# Patient Record
Sex: Male | Born: 1965 | Race: White | Hispanic: No | Marital: Single | Smoking: Current every day smoker
Health system: Southern US, Community
[De-identification: ages and names within clinical notes are randomized; demographics above are authoritative.]

## PROBLEM LIST (undated history)

## (undated) DIAGNOSIS — I252 Old myocardial infarction: Secondary | ICD-10-CM

## (undated) DIAGNOSIS — E785 Hyperlipidemia, unspecified: Secondary | ICD-10-CM

## (undated) DIAGNOSIS — I82403 Acute embolism and thrombosis of unspecified deep veins of lower extremity, bilateral: Secondary | ICD-10-CM

## (undated) DIAGNOSIS — F1911 Other psychoactive substance abuse, in remission: Secondary | ICD-10-CM

## (undated) DIAGNOSIS — I739 Peripheral vascular disease, unspecified: Secondary | ICD-10-CM

## (undated) DIAGNOSIS — F419 Anxiety disorder, unspecified: Secondary | ICD-10-CM

## (undated) DIAGNOSIS — F1011 Alcohol abuse, in remission: Secondary | ICD-10-CM

## (undated) HISTORY — DX: Alcohol abuse, in remission: F10.11

## (undated) HISTORY — PX: FEMORAL BYPASS: SHX50

## (undated) HISTORY — PX: CARDIAC CATHETERIZATION: SHX172

## (undated) HISTORY — PX: CHOLECYSTECTOMY: SHX55

## (undated) HISTORY — DX: Other psychoactive substance abuse, in remission: F19.11

---

## 2001-02-03 ENCOUNTER — Emergency Department (HOSPITAL_COMMUNITY): Admission: EM | Admit: 2001-02-03 | Discharge: 2001-02-04 | Payer: Self-pay | Admitting: Emergency Medicine

## 2001-02-08 ENCOUNTER — Encounter: Payer: Self-pay | Admitting: Emergency Medicine

## 2001-02-08 ENCOUNTER — Emergency Department (HOSPITAL_COMMUNITY): Admission: EM | Admit: 2001-02-08 | Discharge: 2001-02-08 | Payer: Self-pay | Admitting: Emergency Medicine

## 2001-02-11 ENCOUNTER — Emergency Department (HOSPITAL_COMMUNITY): Admission: EM | Admit: 2001-02-11 | Discharge: 2001-02-12 | Payer: Self-pay | Admitting: Emergency Medicine

## 2001-02-12 ENCOUNTER — Encounter: Payer: Self-pay | Admitting: Emergency Medicine

## 2001-02-13 ENCOUNTER — Emergency Department (HOSPITAL_COMMUNITY): Admission: EM | Admit: 2001-02-13 | Discharge: 2001-02-13 | Payer: Self-pay | Admitting: Emergency Medicine

## 2001-02-13 ENCOUNTER — Encounter: Payer: Self-pay | Admitting: Emergency Medicine

## 2001-04-17 ENCOUNTER — Emergency Department (HOSPITAL_COMMUNITY): Admission: EM | Admit: 2001-04-17 | Discharge: 2001-04-17 | Payer: Self-pay | Admitting: Emergency Medicine

## 2001-07-07 ENCOUNTER — Emergency Department (HOSPITAL_COMMUNITY): Admission: EM | Admit: 2001-07-07 | Discharge: 2001-07-07 | Payer: Self-pay

## 2001-07-23 ENCOUNTER — Encounter: Admission: RE | Admit: 2001-07-23 | Discharge: 2001-07-23 | Payer: Self-pay | Admitting: *Deleted

## 2001-08-06 ENCOUNTER — Encounter: Admission: RE | Admit: 2001-08-06 | Discharge: 2001-08-06 | Payer: Self-pay | Admitting: *Deleted

## 2001-10-08 ENCOUNTER — Encounter: Admission: RE | Admit: 2001-10-08 | Discharge: 2001-10-08 | Payer: Self-pay | Admitting: *Deleted

## 2001-10-26 ENCOUNTER — Emergency Department (HOSPITAL_COMMUNITY): Admission: EM | Admit: 2001-10-26 | Discharge: 2001-10-27 | Payer: Self-pay | Admitting: Emergency Medicine

## 2001-10-26 ENCOUNTER — Encounter: Payer: Self-pay | Admitting: Emergency Medicine

## 2001-11-05 ENCOUNTER — Encounter: Admission: RE | Admit: 2001-11-05 | Discharge: 2001-11-05 | Payer: Self-pay | Admitting: *Deleted

## 2002-01-15 ENCOUNTER — Emergency Department (HOSPITAL_COMMUNITY): Admission: EM | Admit: 2002-01-15 | Discharge: 2002-01-16 | Payer: Self-pay | Admitting: Emergency Medicine

## 2003-11-08 ENCOUNTER — Emergency Department (HOSPITAL_COMMUNITY): Admission: EM | Admit: 2003-11-08 | Discharge: 2003-11-08 | Payer: Self-pay

## 2004-02-25 ENCOUNTER — Emergency Department (HOSPITAL_COMMUNITY): Admission: EM | Admit: 2004-02-25 | Discharge: 2004-02-25 | Payer: Self-pay | Admitting: Emergency Medicine

## 2004-02-29 ENCOUNTER — Emergency Department (HOSPITAL_COMMUNITY): Admission: EM | Admit: 2004-02-29 | Discharge: 2004-02-29 | Payer: Self-pay | Admitting: Emergency Medicine

## 2004-03-03 ENCOUNTER — Encounter (INDEPENDENT_AMBULATORY_CARE_PROVIDER_SITE_OTHER): Payer: Self-pay | Admitting: *Deleted

## 2004-03-03 ENCOUNTER — Ambulatory Visit (HOSPITAL_COMMUNITY): Admission: RE | Admit: 2004-03-03 | Discharge: 2004-03-04 | Payer: Self-pay | Admitting: General Surgery

## 2004-03-04 ENCOUNTER — Emergency Department (HOSPITAL_COMMUNITY): Admission: EM | Admit: 2004-03-04 | Discharge: 2004-03-04 | Payer: Self-pay | Admitting: Emergency Medicine

## 2005-06-28 ENCOUNTER — Emergency Department (HOSPITAL_COMMUNITY): Admission: EM | Admit: 2005-06-28 | Discharge: 2005-06-28 | Payer: Self-pay | Admitting: Family Medicine

## 2008-04-05 ENCOUNTER — Ambulatory Visit: Payer: Self-pay | Admitting: Vascular Surgery

## 2008-04-05 ENCOUNTER — Emergency Department (HOSPITAL_COMMUNITY): Admission: EM | Admit: 2008-04-05 | Discharge: 2008-04-05 | Payer: Self-pay | Admitting: Emergency Medicine

## 2008-04-05 ENCOUNTER — Encounter (INDEPENDENT_AMBULATORY_CARE_PROVIDER_SITE_OTHER): Payer: Self-pay | Admitting: Emergency Medicine

## 2008-04-07 ENCOUNTER — Ambulatory Visit (HOSPITAL_COMMUNITY): Admission: RE | Admit: 2008-04-07 | Discharge: 2008-04-07 | Payer: Self-pay | Admitting: Vascular Surgery

## 2008-04-08 ENCOUNTER — Encounter: Payer: Self-pay | Admitting: Vascular Surgery

## 2008-04-08 ENCOUNTER — Inpatient Hospital Stay (HOSPITAL_COMMUNITY): Admission: RE | Admit: 2008-04-08 | Discharge: 2008-04-13 | Payer: Self-pay | Admitting: Vascular Surgery

## 2008-04-09 ENCOUNTER — Encounter: Payer: Self-pay | Admitting: Vascular Surgery

## 2008-05-07 ENCOUNTER — Ambulatory Visit: Payer: Self-pay | Admitting: Vascular Surgery

## 2008-06-23 ENCOUNTER — Ambulatory Visit: Payer: Self-pay | Admitting: Vascular Surgery

## 2008-06-24 ENCOUNTER — Inpatient Hospital Stay (HOSPITAL_COMMUNITY): Admission: RE | Admit: 2008-06-24 | Discharge: 2008-07-09 | Payer: Self-pay | Admitting: Vascular Surgery

## 2008-06-30 ENCOUNTER — Encounter: Payer: Self-pay | Admitting: Vascular Surgery

## 2008-07-02 ENCOUNTER — Ambulatory Visit: Payer: Self-pay | Admitting: Vascular Surgery

## 2008-07-02 ENCOUNTER — Encounter: Payer: Self-pay | Admitting: Vascular Surgery

## 2008-07-03 ENCOUNTER — Encounter: Payer: Self-pay | Admitting: Vascular Surgery

## 2008-07-04 ENCOUNTER — Encounter: Payer: Self-pay | Admitting: Vascular Surgery

## 2008-07-06 ENCOUNTER — Ambulatory Visit: Payer: Self-pay | Admitting: Physical Medicine & Rehabilitation

## 2008-07-14 ENCOUNTER — Ambulatory Visit: Payer: Self-pay | Admitting: Vascular Surgery

## 2008-10-29 ENCOUNTER — Ambulatory Visit: Payer: Self-pay | Admitting: Vascular Surgery

## 2008-11-04 ENCOUNTER — Ambulatory Visit: Payer: Self-pay | Admitting: Surgery

## 2008-11-04 ENCOUNTER — Ambulatory Visit (HOSPITAL_COMMUNITY): Admission: RE | Admit: 2008-11-04 | Discharge: 2008-11-04 | Payer: Self-pay | Admitting: Surgery

## 2008-12-16 ENCOUNTER — Ambulatory Visit: Payer: Self-pay | Admitting: Vascular Surgery

## 2008-12-17 ENCOUNTER — Ambulatory Visit: Payer: Self-pay | Admitting: Vascular Surgery

## 2009-01-07 ENCOUNTER — Emergency Department (HOSPITAL_COMMUNITY): Admission: EM | Admit: 2009-01-07 | Discharge: 2009-01-07 | Payer: Self-pay | Admitting: Emergency Medicine

## 2009-01-18 ENCOUNTER — Ambulatory Visit: Payer: Self-pay | Admitting: Vascular Surgery

## 2009-01-21 ENCOUNTER — Ambulatory Visit: Payer: Self-pay | Admitting: Vascular Surgery

## 2009-01-25 ENCOUNTER — Inpatient Hospital Stay (HOSPITAL_COMMUNITY): Admission: RE | Admit: 2009-01-25 | Discharge: 2009-02-01 | Payer: Self-pay | Admitting: Vascular Surgery

## 2009-01-25 ENCOUNTER — Ambulatory Visit: Payer: Self-pay | Admitting: Vascular Surgery

## 2009-01-25 ENCOUNTER — Encounter: Payer: Self-pay | Admitting: Vascular Surgery

## 2009-01-26 ENCOUNTER — Ambulatory Visit: Payer: Self-pay | Admitting: Oncology

## 2009-01-26 ENCOUNTER — Encounter: Payer: Self-pay | Admitting: Vascular Surgery

## 2009-01-27 ENCOUNTER — Ambulatory Visit: Payer: Self-pay | Admitting: Oncology

## 2009-02-15 ENCOUNTER — Ambulatory Visit: Payer: Self-pay | Admitting: Family Medicine

## 2009-02-15 DIAGNOSIS — K5900 Constipation, unspecified: Secondary | ICD-10-CM | POA: Insufficient documentation

## 2009-02-15 DIAGNOSIS — K449 Diaphragmatic hernia without obstruction or gangrene: Secondary | ICD-10-CM | POA: Insufficient documentation

## 2009-02-15 DIAGNOSIS — F4310 Post-traumatic stress disorder, unspecified: Secondary | ICD-10-CM | POA: Insufficient documentation

## 2009-02-15 DIAGNOSIS — K219 Gastro-esophageal reflux disease without esophagitis: Secondary | ICD-10-CM | POA: Insufficient documentation

## 2009-02-15 DIAGNOSIS — I7389 Other specified peripheral vascular diseases: Secondary | ICD-10-CM | POA: Insufficient documentation

## 2009-02-16 ENCOUNTER — Encounter (INDEPENDENT_AMBULATORY_CARE_PROVIDER_SITE_OTHER): Payer: Self-pay | Admitting: Family Medicine

## 2009-02-16 LAB — CONVERTED CEMR LAB: TSH: 0.572 u[IU]/mL

## 2009-02-18 ENCOUNTER — Telehealth (INDEPENDENT_AMBULATORY_CARE_PROVIDER_SITE_OTHER): Payer: Self-pay | Admitting: Family Medicine

## 2009-03-01 ENCOUNTER — Ambulatory Visit: Payer: Self-pay | Admitting: Family Medicine

## 2009-03-09 ENCOUNTER — Encounter (INDEPENDENT_AMBULATORY_CARE_PROVIDER_SITE_OTHER): Payer: Self-pay | Admitting: Family Medicine

## 2009-03-15 ENCOUNTER — Ambulatory Visit: Payer: Self-pay | Admitting: Family Medicine

## 2009-03-28 ENCOUNTER — Telehealth (INDEPENDENT_AMBULATORY_CARE_PROVIDER_SITE_OTHER): Payer: Self-pay | Admitting: Family Medicine

## 2009-03-29 ENCOUNTER — Ambulatory Visit: Payer: Self-pay | Admitting: Family Medicine

## 2009-03-29 ENCOUNTER — Ambulatory Visit: Payer: Self-pay | Admitting: Oncology

## 2009-03-30 ENCOUNTER — Encounter (INDEPENDENT_AMBULATORY_CARE_PROVIDER_SITE_OTHER): Payer: Self-pay | Admitting: Family Medicine

## 2009-03-30 LAB — CONVERTED CEMR LAB
Basophils Relative: 0 % (ref 0–1)
Eosinophils Absolute: 0.1 10*3/uL (ref 0.0–0.7)
Eosinophils Relative: 1 % (ref 0–5)
Hemoglobin: 14.6 g/dL (ref 13.0–17.0)
MCHC: 33.2 g/dL (ref 30.0–36.0)
MCV: 81.3 fL (ref 78.0–100.0)
Monocytes Absolute: 0.7 10*3/uL (ref 0.1–1.0)
Monocytes Relative: 7 % (ref 3–12)
RBC: 5.41 M/uL (ref 4.22–5.81)

## 2009-04-06 ENCOUNTER — Encounter (INDEPENDENT_AMBULATORY_CARE_PROVIDER_SITE_OTHER): Payer: Self-pay | Admitting: Family Medicine

## 2009-04-06 LAB — CBC WITH DIFFERENTIAL/PLATELET
Basophils Absolute: 0 10*3/uL (ref 0.0–0.1)
EOS%: 0.2 % (ref 0.0–7.0)
HGB: 14 g/dL (ref 13.0–17.1)
MCH: 27.5 pg (ref 27.2–33.4)
MCV: 80.8 fL (ref 79.3–98.0)
MONO%: 6.1 % (ref 0.0–14.0)
RBC: 5.1 10*6/uL (ref 4.20–5.82)
RDW: 14.1 % (ref 11.0–14.6)

## 2009-04-06 LAB — PROTIME-INR: Protime: 14.4 Seconds — ABNORMAL HIGH (ref 10.6–13.4)

## 2009-04-07 ENCOUNTER — Encounter (INDEPENDENT_AMBULATORY_CARE_PROVIDER_SITE_OTHER): Payer: Self-pay | Admitting: Family Medicine

## 2009-04-08 ENCOUNTER — Ambulatory Visit: Payer: Self-pay | Admitting: Vascular Surgery

## 2009-04-08 ENCOUNTER — Encounter (INDEPENDENT_AMBULATORY_CARE_PROVIDER_SITE_OTHER): Payer: Self-pay | Admitting: *Deleted

## 2009-04-08 LAB — CONVERTED CEMR LAB: TSH: 1.252 microintl units/mL

## 2009-04-11 ENCOUNTER — Ambulatory Visit: Payer: Self-pay | Admitting: Family Medicine

## 2009-04-13 ENCOUNTER — Encounter (INDEPENDENT_AMBULATORY_CARE_PROVIDER_SITE_OTHER): Payer: Self-pay | Admitting: Family Medicine

## 2009-04-14 ENCOUNTER — Telehealth (INDEPENDENT_AMBULATORY_CARE_PROVIDER_SITE_OTHER): Payer: Self-pay | Admitting: Family Medicine

## 2009-04-25 ENCOUNTER — Ambulatory Visit (HOSPITAL_COMMUNITY): Payer: Self-pay | Admitting: Psychology

## 2009-05-09 ENCOUNTER — Telehealth (INDEPENDENT_AMBULATORY_CARE_PROVIDER_SITE_OTHER): Payer: Self-pay | Admitting: Family Medicine

## 2009-05-10 ENCOUNTER — Ambulatory Visit (HOSPITAL_COMMUNITY): Payer: Self-pay | Admitting: Psychology

## 2009-05-10 ENCOUNTER — Ambulatory Visit: Payer: Self-pay | Admitting: Family Medicine

## 2009-05-10 ENCOUNTER — Telehealth (INDEPENDENT_AMBULATORY_CARE_PROVIDER_SITE_OTHER): Payer: Self-pay | Admitting: *Deleted

## 2009-05-10 LAB — CONVERTED CEMR LAB
HDL goal, serum: 40 mg/dL
LDL Goal: 70 mg/dL

## 2009-05-24 ENCOUNTER — Ambulatory Visit: Payer: Self-pay | Admitting: Family Medicine

## 2009-05-24 LAB — CONVERTED CEMR LAB: INR: 1.8

## 2009-06-28 ENCOUNTER — Encounter (INDEPENDENT_AMBULATORY_CARE_PROVIDER_SITE_OTHER): Payer: Self-pay | Admitting: Family Medicine

## 2009-07-29 ENCOUNTER — Emergency Department (HOSPITAL_COMMUNITY): Admission: EM | Admit: 2009-07-29 | Discharge: 2009-07-29 | Payer: Self-pay | Admitting: Emergency Medicine

## 2009-07-30 ENCOUNTER — Ambulatory Visit (HOSPITAL_COMMUNITY): Admission: RE | Admit: 2009-07-30 | Discharge: 2009-07-30 | Payer: Self-pay | Admitting: Emergency Medicine

## 2009-09-21 ENCOUNTER — Emergency Department (HOSPITAL_COMMUNITY): Admission: EM | Admit: 2009-09-21 | Discharge: 2009-09-21 | Payer: Self-pay | Admitting: Emergency Medicine

## 2009-10-02 IMAGING — XA IR PTA ILIAC ART INIT VESSEL
12 of 17 series · 12 of 24 positions shown · non-contrast
Comparison: none

EXAMINATION:

RIGHT LOWER EXTREMITY FOLLOWUP ANGIOGRAM AFTER 48 HOURS OF ARTERIAL
THROMBOLYSIS
RIGHT LOWER EXTREMITY ARTERIAL MECHANICAL THROMBECTOMY
PERIPHERAL ARTERIAL 4 AND 5 MM ANGIOPLASTY
REPLACEMENT OF INTRA-ARTERIAL THROMBOLYTIC INFUSION CATHETER AND
INFUSION GUIDE WIRE.
CLINICAL DATA: 41-year-old male thrombosed right distal SFA to below
knee popliteal bypass.

[Series 1: run · 1 of 23 slices shown (1 of 12)]
[im 23/23]
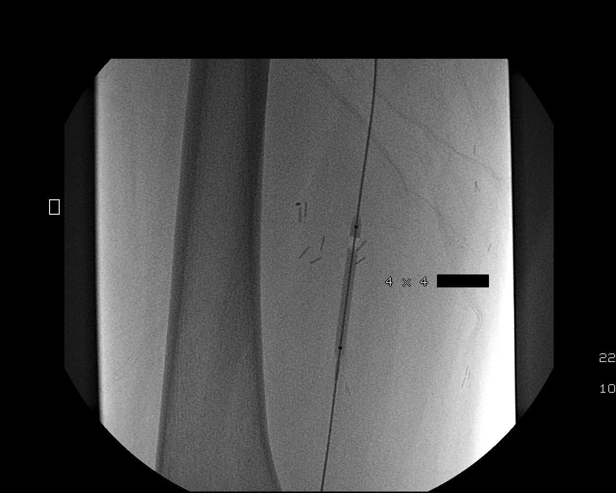

[Series 1: run · 1 of 36 slices shown (2 of 12)]
[im 36/36]
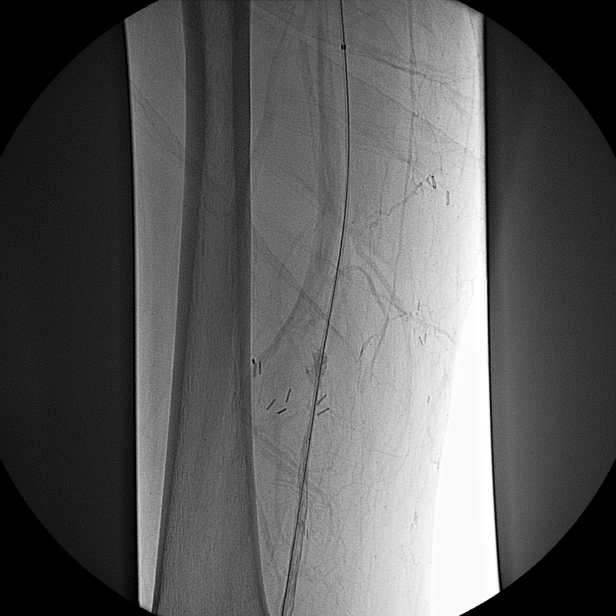

[Series 3: run · 1 of 33 slices shown (3 of 12)]
[im 33/33]
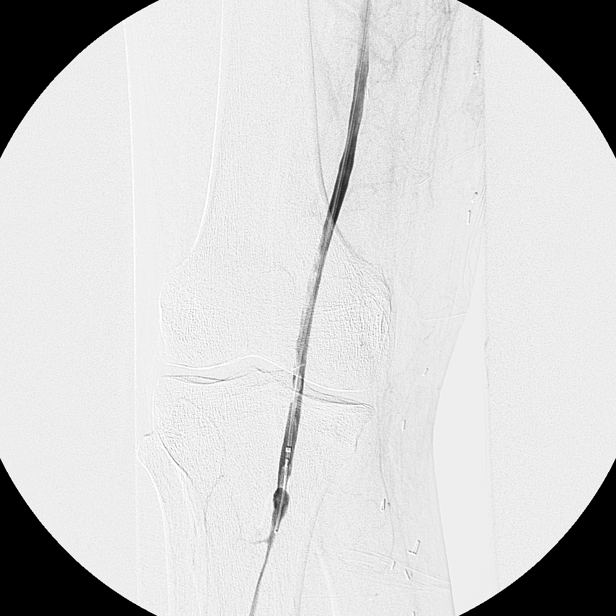

[Series 4: run · 1 of 46 slices shown (4 of 12)]
[im 46/46]
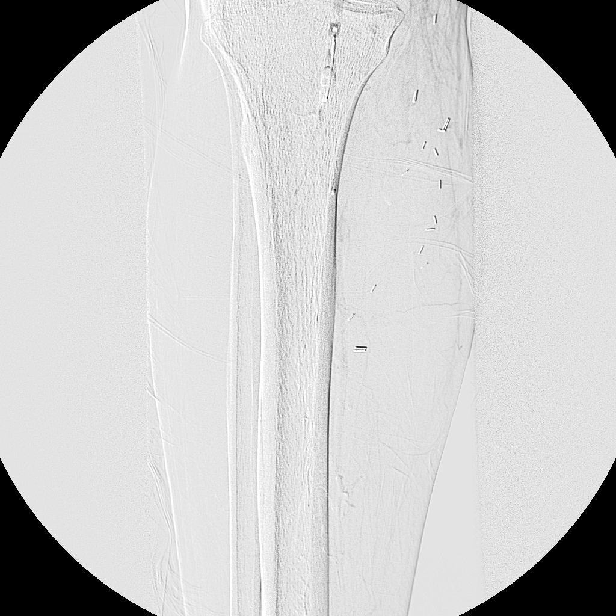

[Series 6: run · 1 of 15 slices shown (5 of 12)]
[im 1/15]
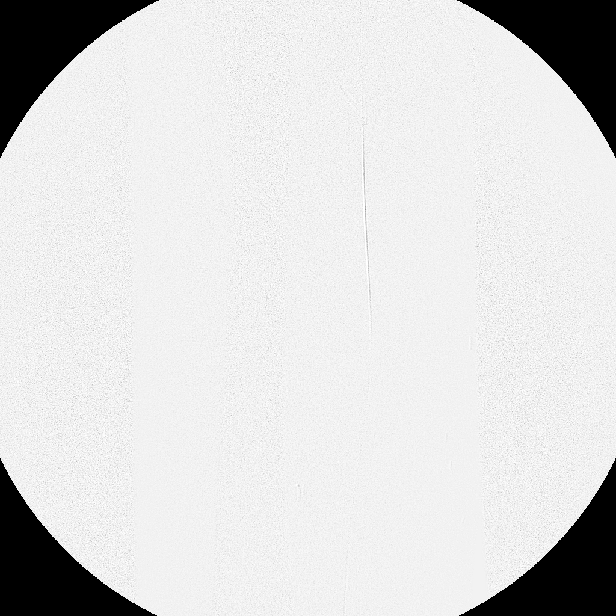

[Series 8: run · 1 of 21 slices shown (6 of 12)]
[im 1/21]
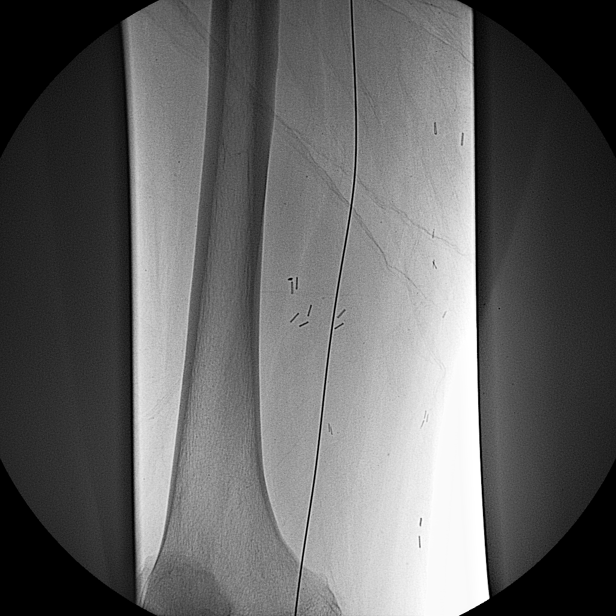

[Series 9: run · 1 of 39 slices shown (7 of 12)]
[im 39/39]
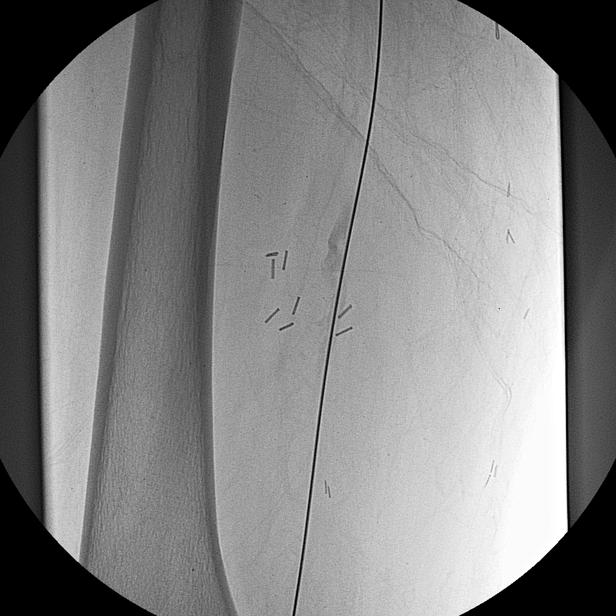

[Series 11: run · 1 of 20 slices shown (8 of 12)]
[im 1/20]
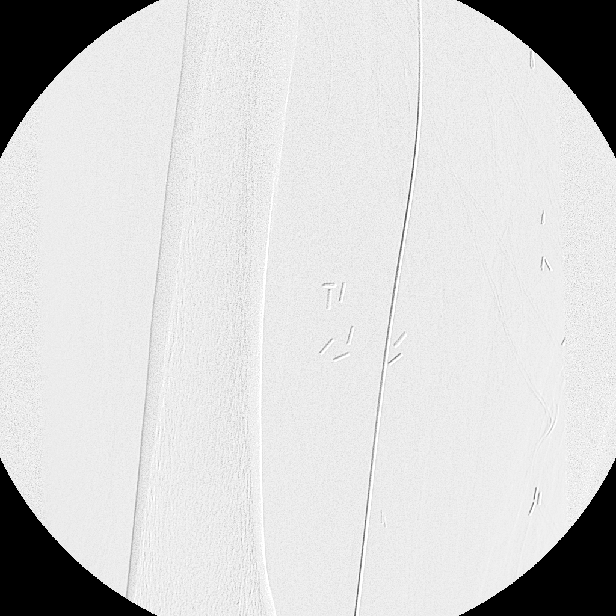

[Series 13: run · 1 of 17 slices shown (9 of 12)]
[im 1/17]
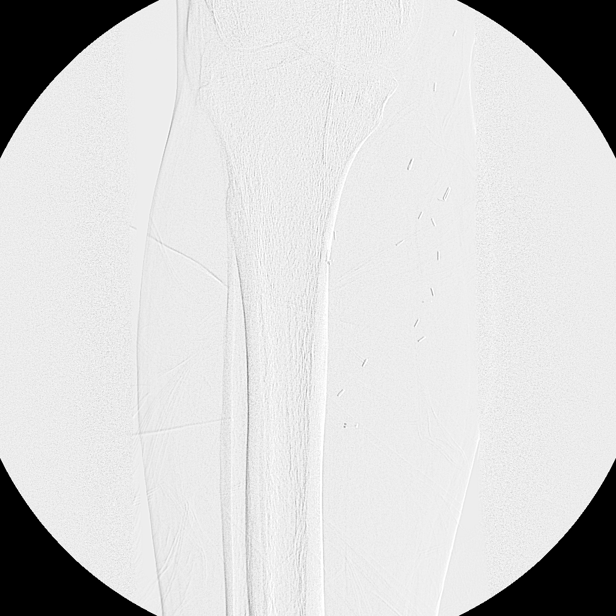

[Series 14: run · 1 of 31 slices shown (10 of 12)]
[im 31/31]
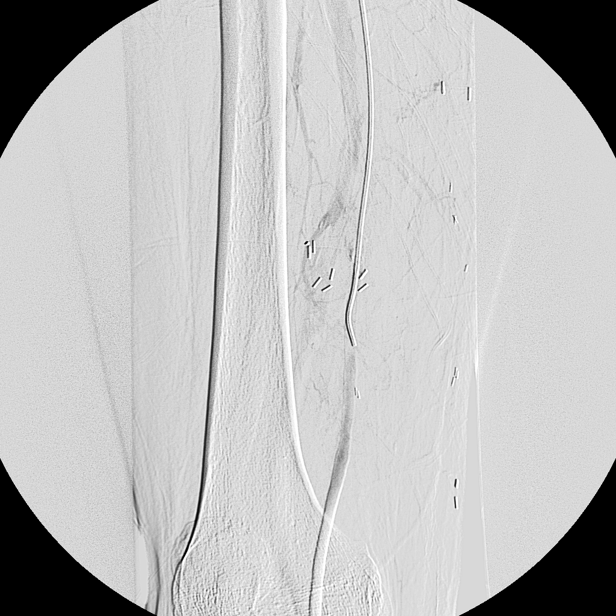

[Series 16: run · 1 of 34 slices shown (11 of 12)]
[im 1/34]
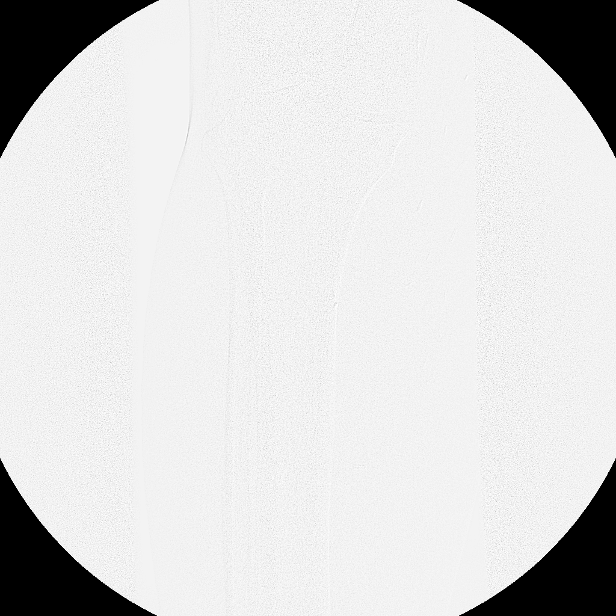

[Series 17: run · 1 of 21 slices shown (12 of 12)]
[im 1/21]
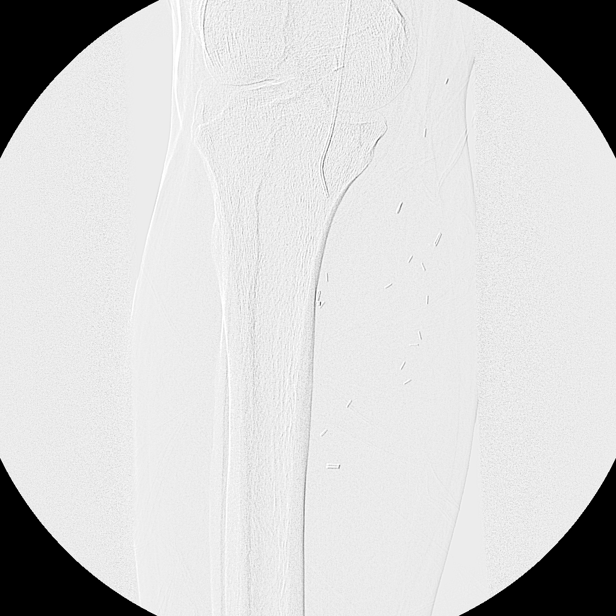

[12 of 24 positions shown; findings below may reference images not displayed]

Date:06/26/2008 [DATE]

Radiologist:RICHARD ADRIAN.Nikky, M.D.

Medications:Versed and Fentanyl for conscious sedation, 600 mcg
intra-arterial nitroglycerin

Guidance:Fluoroscopic

Fluoroscopy time:15 minutes

Sedation time:60 minutes

Contrast volume:125 ml

Complications:No immediate

PROCEDURE/FINDINGS:

Informed consent was obtained from the patient following
explanation of the procedure, risks, benefits and alternatives.
The patient understands, agrees and consents for the procedure.
All questions were addressed.  A time out was performed.

Initially the existing infusion catheter was injected for a follow-
up right lower extremity angiogram.  This demonstrated poor
visualization of the bypass graft and the distal runoff.  The inner
occluding wire was removed and additional angiograms were performed
to visualize the distal runoff.

Right lower extremity angiogram:  Irregular residual filling
defects are present at the proximal anastomosis to the distal SFA.
Small amount of residual intra graft thrombus within the vein
bypass conduit.  Distal anastomosis demonstrates no significant
runoff vessel.  Therefore the inner occluding wire was removed and
repeat injection was performed.  This demonstrated patency of the
distal anastomosis to the tibial peroneal trunk with a single
vessel very small posterior tibial runoff identified.

Mechanical thrombectomy:  The infusion catheter was removed over a
Rosen exchange guide wire.  A 55 cm 6-French sheath was advanced
into the right mid SFA.  Mechanical thrombectomy was performed with
the AngioJet device, total run volume 370 ml heparinized saline
across the entire thrombosed bypass.  Following thrombectomy,
additional angiograms were performed.  This demonstrated very
limited flow through the bypass related to the small caliber distal
outflow.

4 and 5 mm peripheral arterial angioplasty: Over the Rosen exchange
guide wire, initially 4 mm angioplasty was performed of the
proximal anastomosis and throughout the vein bypass conduit
extending to the distal anastomosis.  This was performed with a 4
mm x 4 cm Pitoirizarry Janetzy balloon.  Followup angiogram demonstrated no
significant improvement in flow through the bypass.  Therefore the
balloon was upsized to a 5 mm x 4 cm Pitoirizarry Janetzy balloon.  Repeat
overlapping angioplasty was performed of the proximal anastomosis
and throughout the vein bypass conduit to the distal anastomosis.
Following this, a repeat angiogram again failed to demonstrate
significant flow through the bypass graft related to the small
caliber distal outflow posterior tibial artery.  For better
visualization, a 5-French catheter was advanced over the guide wire
and positioned within the graft.  Contrast injection was performed
to completely opacify the bypass graft.  At this point, there is no
significant residual intragraft stenosis or clot.  Small air
bubbles are present.  Flow remains stagnant.  Reflux across the
arterial anastomosis demonstrates no significant residual proximal
anastomotic stenosis.  However, despite these efforts, flow within
the graft is very sluggish with no significant outflow.  Again,
this appears to be related to the small caliber posterior tibial
outflow below the distal anastomosis.

Replacement of the infusion catheter with the addition of an
infusion guide wire:  A 0.018 SV-8 guide wire was advanced across
the distal anastomosis into the outflow posterior tibial artery.
Over this exchange guide wire, a new 5-French infusion catheter
with a 40 cm infusion length  was advanced with the tip positioned
across the distal anastomosis.  In addition, a 12 cm Manoo
infusion guide wire was advanced into the posterior tibial outflow
more peripherally.  Position was confirmed with fluoroscopy.  The
infusion catheter and infusion guide wire were reconnected for
intra-arterial thrombolytic infusion with T N K , total infusion
0.3 mg per hour.
IMPRESSION: After 48 hours of intra-arterial thrombolysis, residual
clot remains in the right distal SFA to below knee popliteal vein
bypass conduit.

Despite transcatheter mechanical thrombectomy, as well as 4 and 5
mm peripheral angioplasty, arterial flow through the bypass remains
very sluggish.  The proximal anastomosis following this additional
treatment is patent.  The distal anastomosis is also patent however
the outflow artery remains very diminutive in caliber.

Infusion catheter with the addition of a infusion guide wire were
advanced more peripherally across the distal anastomosis into the
outflow posterior tibial artery to continue thrombolytic infusion.

Follow-up angiogram will be performed 06/27/2008.

Critical test results were discussed with Dr. Sine at the time

## 2009-10-08 ENCOUNTER — Emergency Department (HOSPITAL_COMMUNITY): Admission: EM | Admit: 2009-10-08 | Discharge: 2009-10-08 | Payer: Self-pay | Admitting: Emergency Medicine

## 2009-10-14 ENCOUNTER — Ambulatory Visit (HOSPITAL_COMMUNITY): Admission: RE | Admit: 2009-10-14 | Discharge: 2009-10-14 | Payer: Self-pay | Admitting: Family Medicine

## 2009-10-27 ENCOUNTER — Ambulatory Visit: Payer: Self-pay | Admitting: Cardiovascular Disease

## 2009-10-27 ENCOUNTER — Encounter (INDEPENDENT_AMBULATORY_CARE_PROVIDER_SITE_OTHER): Payer: Self-pay | Admitting: *Deleted

## 2009-11-01 ENCOUNTER — Telehealth (INDEPENDENT_AMBULATORY_CARE_PROVIDER_SITE_OTHER): Payer: Self-pay

## 2009-11-01 ENCOUNTER — Encounter: Payer: Self-pay | Admitting: Cardiovascular Disease

## 2009-11-11 ENCOUNTER — Telehealth (INDEPENDENT_AMBULATORY_CARE_PROVIDER_SITE_OTHER): Payer: Self-pay | Admitting: *Deleted

## 2009-11-12 ENCOUNTER — Ambulatory Visit: Payer: Self-pay | Admitting: Vascular Surgery

## 2009-11-12 ENCOUNTER — Encounter: Payer: Self-pay | Admitting: Internal Medicine

## 2009-11-12 ENCOUNTER — Inpatient Hospital Stay (HOSPITAL_COMMUNITY): Admission: EM | Admit: 2009-11-12 | Discharge: 2009-11-25 | Payer: Self-pay | Admitting: Emergency Medicine

## 2009-11-13 ENCOUNTER — Encounter: Payer: Self-pay | Admitting: Vascular Surgery

## 2009-11-14 ENCOUNTER — Encounter: Payer: Self-pay | Admitting: Vascular Surgery

## 2009-11-18 ENCOUNTER — Telehealth (INDEPENDENT_AMBULATORY_CARE_PROVIDER_SITE_OTHER): Payer: Self-pay

## 2009-11-24 ENCOUNTER — Encounter (INDEPENDENT_AMBULATORY_CARE_PROVIDER_SITE_OTHER): Payer: Self-pay | Admitting: *Deleted

## 2009-11-27 ENCOUNTER — Emergency Department (HOSPITAL_COMMUNITY): Admission: EM | Admit: 2009-11-27 | Discharge: 2009-11-27 | Payer: Self-pay | Admitting: Emergency Medicine

## 2009-12-07 ENCOUNTER — Ambulatory Visit: Payer: Self-pay | Admitting: Cardiology

## 2009-12-07 LAB — CONVERTED CEMR LAB: POC INR: 1.6

## 2009-12-12 ENCOUNTER — Emergency Department (HOSPITAL_COMMUNITY): Admission: EM | Admit: 2009-12-12 | Discharge: 2009-12-12 | Payer: Self-pay | Admitting: Emergency Medicine

## 2009-12-12 ENCOUNTER — Encounter (INDEPENDENT_AMBULATORY_CARE_PROVIDER_SITE_OTHER): Payer: Self-pay | Admitting: *Deleted

## 2009-12-21 ENCOUNTER — Ambulatory Visit: Payer: Self-pay | Admitting: Vascular Surgery

## 2010-01-04 ENCOUNTER — Ambulatory Visit: Payer: Self-pay | Admitting: Vascular Surgery

## 2010-01-25 ENCOUNTER — Encounter (INDEPENDENT_AMBULATORY_CARE_PROVIDER_SITE_OTHER): Payer: Self-pay | Admitting: Pharmacist

## 2010-01-25 ENCOUNTER — Encounter (INDEPENDENT_AMBULATORY_CARE_PROVIDER_SITE_OTHER): Payer: Self-pay | Admitting: *Deleted

## 2010-01-25 DIAGNOSIS — F17200 Nicotine dependence, unspecified, uncomplicated: Secondary | ICD-10-CM

## 2010-01-25 DIAGNOSIS — R079 Chest pain, unspecified: Secondary | ICD-10-CM

## 2010-02-06 ENCOUNTER — Ambulatory Visit: Payer: Self-pay | Admitting: Vascular Surgery

## 2010-02-14 ENCOUNTER — Encounter
Admission: RE | Admit: 2010-02-14 | Discharge: 2010-02-17 | Payer: Self-pay | Admitting: Physical Medicine & Rehabilitation

## 2010-02-16 ENCOUNTER — Encounter: Payer: Self-pay | Admitting: Cardiology

## 2010-02-17 ENCOUNTER — Ambulatory Visit: Payer: Self-pay | Admitting: Physical Medicine & Rehabilitation

## 2010-03-06 ENCOUNTER — Other Ambulatory Visit: Payer: Self-pay

## 2010-03-06 ENCOUNTER — Other Ambulatory Visit: Payer: Self-pay | Admitting: Emergency Medicine

## 2010-03-06 ENCOUNTER — Inpatient Hospital Stay (HOSPITAL_COMMUNITY): Admission: AD | Admit: 2010-03-06 | Discharge: 2010-03-16 | Payer: Self-pay | Admitting: Psychiatry

## 2010-03-06 ENCOUNTER — Ambulatory Visit: Payer: Self-pay | Admitting: Psychiatry

## 2010-03-14 ENCOUNTER — Other Ambulatory Visit: Payer: Self-pay | Admitting: Emergency Medicine

## 2010-03-30 ENCOUNTER — Ambulatory Visit: Payer: Self-pay | Admitting: Oncology

## 2010-09-07 ENCOUNTER — Observation Stay (HOSPITAL_COMMUNITY): Admission: EM | Admit: 2010-09-07 | Discharge: 2010-02-06 | Payer: Self-pay | Admitting: Emergency Medicine

## 2010-09-14 ENCOUNTER — Emergency Department (HOSPITAL_COMMUNITY)
Admission: EM | Admit: 2010-09-14 | Discharge: 2010-09-14 | Payer: Self-pay | Source: Home / Self Care | Admitting: Emergency Medicine

## 2010-10-03 ENCOUNTER — Ambulatory Visit: Admit: 2010-10-03 | Payer: Self-pay | Admitting: Vascular Surgery

## 2010-10-11 ENCOUNTER — Ambulatory Visit (HOSPITAL_COMMUNITY)
Admission: RE | Admit: 2010-10-11 | Discharge: 2010-10-11 | Payer: Self-pay | Source: Home / Self Care | Attending: Psychiatry | Admitting: Psychiatry

## 2010-10-22 ENCOUNTER — Encounter: Payer: Self-pay | Admitting: Cardiovascular Disease

## 2010-10-31 NOTE — Progress Notes (Signed)
Summary: PAIN MEDS  Phone Note Call from Patient Call back at [phone]   Caller: crystal overby Reason for Call: Talk to Nurse Summary of Call: calling because he is hurting all the time, legs mostly. Patient would like some pain killers if possible tylenol not working. Initial call taken by: Faythe Ghee,  November 11, 2009 3:07 PM  Follow-up for Phone Call        [facility] we do not prescribe pain meds Follow-up by: Teressa Lower RN,  November 11, 2009 5:13 PM

## 2010-10-31 NOTE — Assessment & Plan Note (Signed)
Summary: **NP3 ABNORMAL ECHO   Visit Type:  Initial Consult Primary Provider:  John Giovanni   History of Present Illness: [name] is seen today at the request of Dr Sudie Bailey for vascular disease and an abnormal "echo" The patient has an extensive history of LE vascular disease with 3 surgeries to the lower extemities and graft failure.  He has bilateral LE to posterior tibial bypasses.  It is unclear to me after reviewing notes from Dr Cyndie Chime and Debera Lat whether he really has a hypercoagulable syndrome or not.  When checked his farctors were normal except for V111 in the setting of acute RLE ischemia which may have been an acute phase reactant.  Nerver the less he is supposed to have been on chronic coumadin over the last year.  At one point he was having it checked at Dr York Spaniel  He has not had it checked in over 6 weeks and today in our office it was non-Rx at 1.3.  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  I reviewed his [place] on his carotid done 10/11/2009 and it only showed mild plaque with no critical stenosis.  As far as I can tell this was the 'abnormal" echo.  Depspite his prematur vascular disease it does not appear tht he has at a ETT or myovue.  After asked he indicates having chest pain when he is stressed.    My concern with him is compliance and the lack of F.U with his INR.  His girlfriend was inquiring about janovetin.  I told her this was just coumadin.  He is a better candidate for Pradaxa 150 two times a day as this would not need to be monitored and would still anticoagulate him in the setting of previous graft failures.    Current Problems (verified): 1)  ? 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 Once Daily 2)  Aspir-Low 81 Mg Tbec (Aspirin) .... Take 1 Tab Daily 3)  Pradaxa 150mg  .... Take 1 Tablet By Mouth Two Times A Day  Allergies (verified): No Known Drug Allergies  Past History:  Past Medical History: Last updated: 10/27/2009 Current Problems:  ? 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  Right superficial femoral artery tibioperoneal trunk bypass with thrombectomy of his right popliteal artery  04/08/2008. 2. Popliteal endartrectomy in October 2009 3. right femoral to posterior tibial bypass   with translocated nonreversed saphenous vein from the left leg, left   popliteal posterior tibial and peroneal artery thrombectomy and vein   patch angioplasty of the left popliteal artery, bilateral lower extremity arteriogram by Dr. Tawanna Cooler Early - April 2010 4. 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 All other systems reviewed and negative except as noted in HPI   Vital Signs:  Patient profile:   45 year old male Height:      70 inches Weight:      210 pounds Pulse rate:   95 / minute BP sitting:   122 / 80  (right arm)  Vitals Entered By: Dreama Saa, CNA (October 27, 2009 2:24 PM)  Physical Exam  General:  Affect appropriate Healthy:  appears stated age HEENT: normal Neck supple with no adenopathy JVP normal no bruits no thyromegaly Lungs clear with no wheezing and good diaphragmatic motion Heart:  S1/S2 no murmur,rub, gallop or click PMI normal Abdomen: benighn, BS positve, no tenderness, no AAA no bruit.  No HSM or HJR Distal pulses intact with no bruits No edema Neuro non-focal Skin warm and dry S/P vascular surgery bilaterally to postierior tibials   Impression & Recommendations:  Problem # 1:  COUMADIN THERAPY (ICD-V58.61) Not clear to me if he has a coagulopathy.  Encouraged him to F/U with Dr Cyndie Chime.  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  he has below knee grafts and would likely benefit from anticoagulation.  Compliance and monitoring issues would make Pradaxa 150 two times a day better choice.  Will see if we can arrange this.  Problem # 2:  PVD WITH CLAUDICATION (ICD-443.89) F/U Dr Arbie Cookey.  No acute ischemia with improved ABI's since last surgery.  Difficult to differentiate neuropathy from claudication but I believe most of his current limitation is neuropathic  Problem # 3:  CHEST PAIN UNSPECIFIED (ICD-786.50) Atypical in the setting of vascular disease and previous smoking.   Stress myovue The following medications were removed from the medication list:    Coumadin 10 Mg Tabs (Warfarin sodium) .Marland Kitchen... Take 10 mg on mon, wed and friday  and sunday with 7.5 mg all others His updated medication list for this problem includes:    Aspir-low 81 Mg Tbec (Aspirin) .Marland Kitchen... Take 1 tab daily  Other Orders: Nuclear Stress Test  (Nuc Stress Test)  Patient Instructions: 1)  Your physician recommends that you schedule a follow-up appointment in: 1 month with Coumadin Clinic and in 3 months with Cardiologist 2)  Your physician has requested that you have an exercise stress myoview.  For further information please visit [email or web address]  Please follow instruction sheet, as given. 3)  Your physician has recommended you make the following change in your medication: Stop taking Coumadin and start taking Pradaxa 150mg  by mouth two times a day  Prescriptions: PRADAXA 150MG  Take 1 tablet by mouth two times a day  #60 x 0   Entered by:   Larita Fife Via LPN   Authorized by:   Colon Branch, MD, [facility]   Signed by:   Larita Fife Via LPN on 16/07/9603   Method used:   Faxed to ...       [facility] (mail-order)       [address],  [place]  [address]       Ph: [phone]       Fax: [phone]   RxID:   [id number]    EKG Report  Procedure date:  10/27/2009  Findings:      NSR 83 RAD Borderline ECG

## 2010-10-31 NOTE — Letter (Signed)
Summary: Appointment - Missed  [facility] [place]  [address], [place] [address]   Phone: [phone]  Fax: [phone]     January 25, 2010 MRN: [id number]   [name] [address] [place], [place]  [address]   Dear [name],  Our records indicate you missed your appointment on          01/25/10              with Dr.       .        ROTHBART                            It is very important that we reach you to reschedule this appointment. We look forward to participating in your health care needs. Please contact us at the number listed above at your earliest convenience to reschedule this appointment.     Sincerely,    Glass blower/designer

## 2010-10-31 NOTE — Miscellaneous (Signed)
Summary: LABS TSH 04/08/2009  Clinical Lists Changes  Observations: Added new observation of TSH: 1.252 microintl units/mL (04/08/2009 9:14)

## 2010-10-31 NOTE — Letter (Signed)
Summary: CAROTID  CAROTID   Imported By: Faythe Ghee 11/01/2009 12:16:33  _____________________________________________________________________  External Attachment:    Type:   Image     Comment:   External Document

## 2010-10-31 NOTE — Letter (Signed)
Summary: [place] [facility]readmill (Nuc Med Stress)  [facility]  [address] [place] [address]   Phone: [phone]  Fax: [phone]    Nuclear Medicine 1-Day Stress Test Information Sheet  Re:     [name]   DOB:     Jun 04, 1966 MRN:     [id number] Weight:  Appointment Date: Register at: Appointment Time: Referring MD:  _X__Exercise Stress  __Adenosine   __Dobutamine  __Lexiscan  __Persantine   __Thallium  Urgency: ____1 (next day)   ____2 (one week)    ____3 (PRN)  Patient will receive Follow Up call with results: Patient needs follow-up appointment:  Instructions regarding medication:  How to prepare for your stress test: 1. DO NOT eat or dring 6 hours prior to your arrival time. This includes no caffeine (coffee, tea, sodas, chocolate) if you were instructed to take your medications, drink water with it. 2. DO NOT use any tobacco products for at leaset 8 hours prior to arrival. 3. DO NOT wear dresses or any clothing that may have metal clasps or buttons. 4. Wear short sleeve shirts, loose clothing, and comfortalbe walking shoes. 5. DO NOT use lotions, oils or powder on your chest before the test. 6. The test will take approximately 3-4 hours from the time you arrive until completion. 7. To register the day of the test, go to the Short Stay entrance at [facility]. 8. If you must cancel your test, call [phone] as soon as you are aware.  After you arrive for test:   When you arrive at [facility], you will go to Short Stay to be registered. They will then send you to Radiology to check in. The Nuclear Medicine Tech will get you and start an IV in your arm or hand. A small amount of a radioactive tracer will then be injected into your IV. This tracer will then have to circulate for 30-45 minutes. During this time you will wait in the waiting room and you will be able to drink something without caffeine. A series of pictures will be taken  of your heart follwoing this waiting period. After the 1st set of pictures you will go to the stress lab to get ready for your stress test. During the stress test, another small amount of a radioactive tracer will be injected through your IV. When the stress test is complete, there is a short rest period while your heart rate and blood pressure will be monitored. When this monitoring period is complete you will have another set of pictrues taken. (The same as the 1st set of pictures). These pictures are taken between 15 minutes and 1 hour after the stress test. The time depends on the type of stress test you had. Your doctor will inform you of your test results within 7 days after test.    The possibilities of certain changes are possible during the test. They include abnormal blood pressure and disorders of the heart. Side effects of persantine or adenosine can include flushing, chest pain, shortness of breath, stomach tightness, headache and light-headedness. These side effects usually do not last long and are self-resolving. Every effort will be made to keep you comfortable and to minimize complications by obtaining a medical history and by close observation during the test. Emergency equipment, medications, and trained personnel are available to deal with any unusual situation which may arise.  Please notify office at least 48 hours in advance if you are unable to keep  this appt.

## 2010-10-31 NOTE — Letter (Signed)
Summary: Appointment - Missed  [facility] [place]  [address] [place] [address]   Phone: [phone]  Fax: [phone]     December 12, 2009 MRN: [id number]   [name] [address], [place]  [address]   Dear [name],  Our records indicate you missed your appointment on 12/12/09 with Coumadin Clinic                               It is very important that we reach you to reschedule this appointment. We look forward to participating in your health care needs. Please contact us at the number listed above at your earliest convenience to reschedule this appointment.     Sincerely,    Glass blower/designer

## 2010-10-31 NOTE — Progress Notes (Signed)
Summary: Medication  Phone Note Call from Patient Call back at [phone]   Reason for Call: Talk to Nurse Summary of Call: Pt has questions regarding Pradaxa/states that it was to be sent into [facility] in [place]/in EMR it was sent to patient assistance program/pls call patient/tg Initial call taken by: Raechel Ache [facility],  November 01, 2009 10:13 AM  Follow-up for Phone Call        Faxed RX to [facility]. Left message on PT's phone. Follow-up by: Larita Fife Via LPN,  November 01, 2009 1:59 PM    New/Updated Medications: * PRADAXA 150MG  Take 1 tablet by mouth two times a day Prescriptions: PRADAXA 150MG  Take 1 tablet by mouth two times a day  #60 x 6   Entered by:   Larita Fife Via LPN   Authorized by:   Colon Branch, MD, [facility]   Signed by:   Larita Fife Via LPN on 09/81/1914   Method used:   Faxed to ...       [facility]  [address] [facility] (retail)       [address]       [place], [place]  [address]       Ph: [phone]       Fax: [phone]   RxID:   [id number]

## 2010-10-31 NOTE — Medication Information (Signed)
Summary: CCR  Anticoagulant Therapy  Managed by: Vashti Hey, RN PCP: John Giovanni Supervising MD: Diona Browner MD, Remi Deter Indication 1: DVT Lab Used: [facility] [place] [facility]: [place] INR POC 1.6  Dietary changes: no    Health status changes: no    Bleeding/hemorrhagic complications: no    Recent/future hospitalizations: yes       Details: S/P [facility] 11/12/09 - 11/25/09 for thrombus in Lt leg with ischemic changes  Any changes in medication regimen? yes       Details: restarted on coumadin in hosp.  Sent home on 10mg  qd  Has 10mg  tablet  Recent/future dental: no  Any missed doses?: yes     Details: was off 2 days while in [facility] from [facility] for secon opinion  Is patient compliant with meds? no     Details: history of non-compliance on warfarin in the past   Allergies: No Known Drug Allergies  Anticoagulation Management History:      The patient is taking warfarin and comes in today for a routine follow up visit.  Anticoagulation is being administered due to Severe PVD and Rx for life pr Dr. Cyndie Chime of Heme recommendations.  Negative risk factors for bleeding include an age less than 59 years old, no history of CVA/TIA, no history of GI bleeding, and absence of serious comorbidities.  The bleeding index is 'low risk'.  Negative CHADS2 values include History of CHF, History of HTN, Age > 64 years old, History of Diabetes, and Prior Stroke/CVA/TIA.  Plans are to continue warfarin for life.  His last INR was 1.8.  Anticoagulation responsible provider: Diona Browner MD, Remi Deter.  INR POC: 1.6.  Cuvette Lot#: [id number].    Anticoagulation Management Assessment/Plan:      The target INR is 2.0-3.0.  The next INR is due 12/12/2009.  Anticoagulation instructions were given to patient.  Results were reviewed/authorized by Vashti Hey, RN.  He was notified by Vashti Hey RN.         Current Anticoagulation Instructions: INR 1.6 Take coumadin  15mg  tonight and tomorrow night then resume 10mg  once daily

## 2010-10-31 NOTE — Letter (Signed)
Summary: External Correspondence  External Correspondence   Imported By: Faythe Ghee 11/01/2009 12:16:54  _____________________________________________________________________  External Attachment:    Type:   Image     Comment:   External Document

## 2010-10-31 NOTE — Letter (Signed)
Summary: Appointment - Missed  [facility] [place]  [address] [place] [address]   Phone: [phone]  Fax: [phone]     November 24, 2009 MRN: [id number]   JOHNWESLEY LEDERMAN [address], [place]  [address]   Dear Mr. MARQUIS,  Our records indicate you missed your appointment on   11/24/09   with COUMADIN CLINIC. It is very important that we reach you to reschedule this appointment. We look forward to participating in your health care needs. Please contact us at the number listed above at your earliest convenience to reschedule this appointment.     Sincerely,    Glass blower/designer

## 2010-10-31 NOTE — Letter (Signed)
Summary: Custom - Delinquent Coumadin 2  [facility]  [address], [place] [address]   Phone: [phone]  Fax: [phone]     January 25, 2010 MRN: [id number]   [name] [address] [place], [place]  [address]   Dear [name],  We have attempted to contact you by phone and letter on multiple occasions to contact our office for important blood work associated with the blood thinner, warfarin (Coumadin).  Warfarin is a very important drug that can cause life threatening side effects including, bleeding, and thus requires close laboratory monitoring.  We are unable to accept responsibility for blood thinner-related health problems you may develop because you have not followed our recommendations for appropriate monitoring.  These may include abnormal bleeding occurrences and/or development of blood clots (stroke, heart attack, blood clots in legs or lungs, etc.).  We need for you to contact this office at the number listed above to schedule and complete this very important blood work.  Thank you for your assistance in this urgent matter.  Sincerely, Vashti Hey, RN [facility] Team

## 2010-10-31 NOTE — Progress Notes (Signed)
**Note De-Identified Nathalya Wolanski Obfuscation** Summary: Pt. in [facility]/Stress test  Phone Note Outgoing Call   Call placed by: Larita Fife Arthur Aydelotte LPN,  November 18, 2009 9:28 AM Summary of Call: He did not have stress test done on 11-09-09. Pt's sister states pt. is in [facility] due to problems with his legs. She states they will call us later to reschedule.

## 2010-10-31 NOTE — Medication Information (Signed)
Summary: Coumadin Clinic  Anticoagulant Therapy  Managed by: Inactive PCP: Dr. Katharine Look Supervising MD: Dietrich Pates MD, Molly Maduro Indication 1: DVT Lab Used: [facility] [place] Site: [facility]          Comments: Called pt.  States he is being managed by Dr Tanya Nones now (PMD).  Allergies: No Known Drug Allergies  Anticoagulation Management History:      The patient is taking warfarin for Severe PVD and Rx for life pr Dr. Cyndie Chime of Heme recommendations.  Negative risk factors for bleeding include an age less than 25 years old, no history of CVA/TIA, no history of GI bleeding, and absence of serious comorbidities.  The bleeding index is 'low risk'.  Negative CHADS2 values include History of CHF, History of HTN, Age > 12 years old, History of Diabetes, and Prior Stroke/CVA/TIA.  Plans are to continue warfarin for life.  His last INR was 1.8.  Anticoagulation responsible provider: Dietrich Pates MD, Molly Maduro.    Anticoagulation Management Assessment/Plan:      The target INR is 2.0-3.0.  The next INR is due 12/12/2009.  Anticoagulation instructions were given to patient.  Results were reviewed/authorized by Inactive.         Prior Anticoagulation Instructions: INR 1.6 Take coumadin 15mg  tonight and tomorrow night then resume 10mg  once daily

## 2010-12-11 LAB — BASIC METABOLIC PANEL
BUN: 5 mg/dL — ABNORMAL LOW (ref 6–23)
Calcium: 8.8 mg/dL (ref 8.4–10.5)
Chloride: 109 mEq/L (ref 96–112)
Creatinine, Ser: 0.91 mg/dL (ref 0.4–1.5)
GFR calc non Af Amer: >60 mL/min (ref >60)

## 2010-12-11 LAB — DIFFERENTIAL
Basophils Absolute: 0 10*3/uL (ref 0.0–0.1)
Eosinophils Relative: 1 % (ref 0–5)
Lymphocytes Relative: 34 % (ref 12–46)

## 2010-12-11 LAB — PROTIME-INR: Prothrombin Time: 13.7 seconds (ref 11.6–15.2)

## 2010-12-11 LAB — CBC
MCV: 78.5 fL (ref 78.0–100.0)
Platelets: 267 10*3/uL (ref 150–400)
RDW: 16.1 % — ABNORMAL HIGH (ref 11.5–15.5)
WBC: 8.5 10*3/uL (ref 4.0–10.5)

## 2010-12-11 LAB — POCT CARDIAC MARKERS: Myoglobin, poc: 69 ng/mL (ref 12–200)

## 2010-12-17 LAB — CARDIAC PANEL(CRET KIN+CKTOT+MB+TROPI)
CK, MB: 1 ng/mL (ref 0.3–4.0)
CK, MB: 1.2 ng/mL (ref 0.3–4.0)
Relative Index: INVALID (ref 0.0–2.5)
Relative Index: INVALID (ref 0.0–2.5)
Total CK: 62 U/L (ref 7–232)
Troponin I: 0.01 ng/mL (ref 0.00–0.06)
Troponin I: 0.01 ng/mL (ref 0.00–0.06)

## 2010-12-17 LAB — PROTIME-INR
INR: 1.51 — ABNORMAL HIGH (ref 0.00–1.49)
INR: 2.51 — ABNORMAL HIGH (ref 0.00–1.49)
Prothrombin Time: 26.9 seconds — ABNORMAL HIGH (ref 11.6–15.2)

## 2010-12-17 LAB — DIFFERENTIAL
Basophils Absolute: 0 10*3/uL (ref 0.0–0.1)
Basophils Relative: 0 % (ref 0–1)
Eosinophils Relative: 1 % (ref 0–5)
Monocytes Absolute: 0.6 10*3/uL (ref 0.1–1.0)

## 2010-12-17 LAB — CBC
HCT: 44.4 % (ref 39.0–52.0)
Hemoglobin: 14.9 g/dL (ref 13.0–17.0)
MCHC: 33.6 g/dL (ref 30.0–36.0)
MCV: 82.1 fL (ref 78.0–100.0)
MCV: 72.6 fL — ABNORMAL LOW (ref 78.0–100.0)
Platelets: 342 10*3/uL (ref 150–400)
RBC: 4.42 MIL/uL (ref 4.22–5.81)
RDW: 14.4 % (ref 11.5–15.5)
WBC: 9.1 10*3/uL (ref 4.0–10.5)

## 2010-12-17 LAB — BASIC METABOLIC PANEL
CO2: 28 mEq/L (ref 19–32)
Calcium: 9.1 mg/dL (ref 8.4–10.5)
Glucose, Bld: 87 mg/dL (ref 70–99)
Potassium: 4 mEq/L (ref 3.5–5.1)
Sodium: 140 mEq/L (ref 135–145)

## 2010-12-17 LAB — URINE CULTURE: Colony Count: NO GROWTH

## 2010-12-18 LAB — BASIC METABOLIC PANEL
BUN: 8 mg/dL (ref 6–23)
Calcium: 8.4 mg/dL (ref 8.4–10.5)
Chloride: 108 mEq/L (ref 96–112)
Creatinine, Ser: 0.84 mg/dL (ref 0.4–1.5)
GFR calc Af Amer: >60 mL/min (ref >60)
GFR calc non Af Amer: >60 mL/min (ref >60)

## 2010-12-18 LAB — URINALYSIS, ROUTINE W REFLEX MICROSCOPIC
Glucose, UA: NEGATIVE mg/dL
Hgb urine dipstick: NEGATIVE
Protein, ur: NEGATIVE mg/dL
Specific Gravity, Urine: 1.03 (ref 1.005–1.030)
Urobilinogen, UA: 1 mg/dL (ref 0.0–1.0)

## 2010-12-18 LAB — URINE MICROSCOPIC-ADD ON

## 2010-12-18 LAB — DIFFERENTIAL
Basophils Relative: 2 % — ABNORMAL HIGH (ref 0–1)
Eosinophils Absolute: 0.1 10*3/uL (ref 0.0–0.7)
Eosinophils Absolute: 0.4 10*3/uL (ref 0.0–0.7)
Eosinophils Relative: 1 % (ref 0–5)
Lymphs Abs: 2.4 10*3/uL (ref 0.7–4.0)
Monocytes Absolute: 0.6 10*3/uL (ref 0.1–1.0)
Monocytes Relative: 6 % (ref 3–12)
Monocytes Relative: 7 % (ref 3–12)
Neutro Abs: 7.1 10*3/uL (ref 1.7–7.7)
Neutrophils Relative %: 66 % (ref 43–77)

## 2010-12-18 LAB — PROTIME-INR
INR: 1.26 (ref 0.00–1.49)
INR: 1.3 (ref 0.00–1.49)
INR: 2.22 — ABNORMAL HIGH (ref 0.00–1.49)
INR: 2.23 — ABNORMAL HIGH (ref 0.00–1.49)
INR: 2.29 — ABNORMAL HIGH (ref 0.00–1.49)
Prothrombin Time: 15.7 seconds — ABNORMAL HIGH (ref 11.6–15.2)
Prothrombin Time: 21.8 seconds — ABNORMAL HIGH (ref 11.6–15.2)
Prothrombin Time: 24.5 seconds — ABNORMAL HIGH (ref 11.6–15.2)

## 2010-12-18 LAB — CBC
Platelets: 341 10*3/uL (ref 150–400)
Platelets: 388 10*3/uL (ref 150–400)
RBC: 4.55 MIL/uL (ref 4.22–5.81)
RDW: 17.3 % — ABNORMAL HIGH (ref 11.5–15.5)
WBC: 10.8 10*3/uL — ABNORMAL HIGH (ref 4.0–10.5)
WBC: 9.5 10*3/uL (ref 4.0–10.5)

## 2010-12-18 LAB — COMPREHENSIVE METABOLIC PANEL
ALT: 27 U/L (ref 0–53)
AST: 28 U/L (ref 0–37)
Albumin: 4.2 g/dL (ref 3.5–5.2)
Alkaline Phosphatase: 103 U/L (ref 39–117)
Chloride: 102 mEq/L (ref 96–112)
Potassium: 3.3 mEq/L — ABNORMAL LOW (ref 3.5–5.1)
Sodium: 141 mEq/L (ref 135–145)
Total Bilirubin: 0.6 mg/dL (ref 0.3–1.2)
Total Protein: 7.9 g/dL (ref 6.0–8.3)

## 2010-12-18 LAB — APTT: aPTT: 31 seconds (ref 24–37)

## 2010-12-18 LAB — RAPID URINE DRUG SCREEN, HOSP PERFORMED
Amphetamines: NOT DETECTED
Barbiturates: NOT DETECTED
Benzodiazepines: POSITIVE — AB
Opiates: NOT DETECTED

## 2010-12-18 LAB — ETHANOL: Alcohol, Ethyl (B): <5 mg/dL (ref 0–10)

## 2010-12-18 LAB — GLUCOSE, CAPILLARY: Glucose-Capillary: 164 mg/dL — ABNORMAL HIGH (ref 70–99)

## 2010-12-18 LAB — URINE CULTURE: Colony Count: NO GROWTH

## 2010-12-19 LAB — CBC
HCT: 36.3 % — ABNORMAL LOW (ref 39.0–52.0)
HCT: 36.4 % — ABNORMAL LOW (ref 39.0–52.0)
Hemoglobin: 11.9 g/dL — ABNORMAL LOW (ref 13.0–17.0)
Hemoglobin: 11.9 g/dL — ABNORMAL LOW (ref 13.0–17.0)
MCHC: 32.6 g/dL (ref 30.0–36.0)
Platelets: 268 10*3/uL (ref 150–400)
Platelets: 274 10*3/uL (ref 150–400)
RDW: 16.2 % — ABNORMAL HIGH (ref 11.5–15.5)
RDW: 16.6 % — ABNORMAL HIGH (ref 11.5–15.5)
WBC: 11.1 10*3/uL — ABNORMAL HIGH (ref 4.0–10.5)
WBC: 9.1 10*3/uL (ref 4.0–10.5)

## 2010-12-19 LAB — BASIC METABOLIC PANEL
BUN: 7 mg/dL (ref 6–23)
CO2: 30 mEq/L (ref 19–32)
Calcium: 8.7 mg/dL (ref 8.4–10.5)
Creatinine, Ser: 0.87 mg/dL (ref 0.4–1.5)
GFR calc non Af Amer: >60 mL/min (ref >60)
Glucose, Bld: 90 mg/dL (ref 70–99)
Potassium: 3.9 mEq/L (ref 3.5–5.1)
Sodium: 143 mEq/L (ref 135–145)

## 2010-12-19 LAB — PROTIME-INR: INR: 1.62 — ABNORMAL HIGH (ref 0.00–1.49)

## 2010-12-19 LAB — DIFFERENTIAL
Basophils Absolute: 0 10*3/uL (ref 0.0–0.1)
Basophils Relative: 0 % (ref 0–1)
Eosinophils Relative: 1 % (ref 0–5)
Monocytes Absolute: 0.7 10*3/uL (ref 0.1–1.0)

## 2010-12-20 LAB — CBC
HCT: 28.6 % — ABNORMAL LOW (ref 39.0–52.0)
HCT: 31.9 % — ABNORMAL LOW (ref 39.0–52.0)
HCT: 32.7 % — ABNORMAL LOW (ref 39.0–52.0)
HCT: 33.2 % — ABNORMAL LOW (ref 39.0–52.0)
HCT: 33.5 % — ABNORMAL LOW (ref 39.0–52.0)
HCT: 38.8 % — ABNORMAL LOW (ref 39.0–52.0)
Hemoglobin: 10.9 g/dL — ABNORMAL LOW (ref 13.0–17.0)
Hemoglobin: 11 g/dL — ABNORMAL LOW (ref 13.0–17.0)
Hemoglobin: 11.3 g/dL — ABNORMAL LOW (ref 13.0–17.0)
MCHC: 33.6 g/dL (ref 30.0–36.0)
MCHC: 33.8 g/dL (ref 30.0–36.0)
MCHC: 34 g/dL (ref 30.0–36.0)
MCHC: 34 g/dL (ref 30.0–36.0)
MCHC: 34.2 g/dL (ref 30.0–36.0)
MCHC: 34.2 g/dL (ref 30.0–36.0)
MCHC: 34.4 g/dL (ref 30.0–36.0)
MCHC: 34.4 g/dL (ref 30.0–36.0)
MCHC: 34.4 g/dL (ref 30.0–36.0)
MCHC: 34.5 g/dL (ref 30.0–36.0)
MCV: 83.2 fL (ref 78.0–100.0)
MCV: 83.3 fL (ref 78.0–100.0)
MCV: 84.1 fL (ref 78.0–100.0)
MCV: 84.2 fL (ref 78.0–100.0)
MCV: 84.4 fL (ref 78.0–100.0)
MCV: 84.5 fL (ref 78.0–100.0)
MCV: 84.5 fL (ref 78.0–100.0)
MCV: 84.8 fL (ref 78.0–100.0)
MCV: 85.2 fL (ref 78.0–100.0)
Platelets: 205 10*3/uL (ref 150–400)
Platelets: 205 10*3/uL (ref 150–400)
Platelets: 208 10*3/uL (ref 150–400)
Platelets: 275 10*3/uL (ref 150–400)
Platelets: 356 10*3/uL (ref 150–400)
Platelets: 387 10*3/uL (ref 150–400)
Platelets: 401 10*3/uL — ABNORMAL HIGH (ref 150–400)
Platelets: 401 10*3/uL — ABNORMAL HIGH (ref 150–400)
Platelets: 402 10*3/uL — ABNORMAL HIGH (ref 150–400)
Platelets: 427 10*3/uL — ABNORMAL HIGH (ref 150–400)
RBC: 3.7 MIL/uL — ABNORMAL LOW (ref 4.22–5.81)
RBC: 4.67 MIL/uL (ref 4.22–5.81)
RDW: 14.3 % (ref 11.5–15.5)
RDW: 14.4 % (ref 11.5–15.5)
RDW: 14.6 % (ref 11.5–15.5)
RDW: 14.7 % (ref 11.5–15.5)
RDW: 14.8 % (ref 11.5–15.5)
RDW: 14.8 % (ref 11.5–15.5)
RDW: 14.8 % (ref 11.5–15.5)
RDW: 14.8 % (ref 11.5–15.5)
RDW: 15 % (ref 11.5–15.5)
WBC: 6.4 10*3/uL (ref 4.0–10.5)
WBC: 8.6 10*3/uL (ref 4.0–10.5)
WBC: 8.8 10*3/uL (ref 4.0–10.5)
WBC: 9.3 10*3/uL (ref 4.0–10.5)
WBC: 9.5 10*3/uL (ref 4.0–10.5)

## 2010-12-20 LAB — HEPARIN LEVEL (UNFRACTIONATED)
Heparin Unfractionated: 0.25 IU/mL — ABNORMAL LOW (ref 0.30–0.70)
Heparin Unfractionated: 0.26 IU/mL — ABNORMAL LOW (ref 0.30–0.70)
Heparin Unfractionated: 0.3 IU/mL (ref 0.30–0.70)
Heparin Unfractionated: 0.32 IU/mL (ref 0.30–0.70)
Heparin Unfractionated: 0.35 IU/mL (ref 0.30–0.70)
Heparin Unfractionated: 0.45 IU/mL (ref 0.30–0.70)
Heparin Unfractionated: 0.5 IU/mL (ref 0.30–0.70)
Heparin Unfractionated: 0.52 IU/mL (ref 0.30–0.70)
Heparin Unfractionated: 0.63 IU/mL (ref 0.30–0.70)

## 2010-12-20 LAB — PROTIME-INR
INR: 1.06 (ref 0.00–1.49)
INR: 2.01 — ABNORMAL HIGH (ref 0.00–1.49)
Prothrombin Time: 13.7 seconds (ref 11.6–15.2)
Prothrombin Time: 21.2 seconds — ABNORMAL HIGH (ref 11.6–15.2)

## 2010-12-20 LAB — POCT I-STAT, CHEM 8
BUN: 14 mg/dL (ref 6–23)
Calcium, Ion: 1.09 mmol/L — ABNORMAL LOW (ref 1.12–1.32)
Chloride: 107 mEq/L (ref 96–112)
Creatinine, Ser: 0.6 mg/dL (ref 0.4–1.5)
Glucose, Bld: 88 mg/dL (ref 70–99)
TCO2: 27 mmol/L (ref 0–100)

## 2010-12-20 LAB — BASIC METABOLIC PANEL
BUN: 3 mg/dL — ABNORMAL LOW (ref 6–23)
BUN: 3 mg/dL — ABNORMAL LOW (ref 6–23)
BUN: 4 mg/dL — ABNORMAL LOW (ref 6–23)
CO2: 25 mEq/L (ref 19–32)
CO2: 30 mEq/L (ref 19–32)
CO2: 31 mEq/L (ref 19–32)
Calcium: 7.9 mg/dL — ABNORMAL LOW (ref 8.4–10.5)
Calcium: 8 mg/dL — ABNORMAL LOW (ref 8.4–10.5)
Calcium: 8.2 mg/dL — ABNORMAL LOW (ref 8.4–10.5)
Chloride: 107 mEq/L (ref 96–112)
Chloride: 108 mEq/L (ref 96–112)
Creatinine, Ser: 0.81 mg/dL (ref 0.4–1.5)
Creatinine, Ser: 0.83 mg/dL (ref 0.4–1.5)
GFR calc non Af Amer: >60 mL/min (ref >60)
Glucose, Bld: 124 mg/dL — ABNORMAL HIGH (ref 70–99)
Glucose, Bld: 93 mg/dL (ref 70–99)
Glucose, Bld: 93 mg/dL (ref 70–99)
Potassium: 3.7 mEq/L (ref 3.5–5.1)
Sodium: 137 mEq/L (ref 135–145)
Sodium: 140 mEq/L (ref 135–145)
Sodium: 142 mEq/L (ref 135–145)

## 2010-12-20 LAB — DIFFERENTIAL
Basophils Relative: 0 % (ref 0–1)
Eosinophils Absolute: 0.2 10*3/uL (ref 0.0–0.7)
Eosinophils Relative: 3 % (ref 0–5)
Lymphs Abs: 2.2 10*3/uL (ref 0.7–4.0)
Monocytes Relative: 9 % (ref 3–12)
Neutrophils Relative %: 64 % (ref 43–77)

## 2011-01-01 LAB — DIFFERENTIAL
Basophils Absolute: 0 10*3/uL (ref 0.0–0.1)
Basophils Relative: 1 % (ref 0–1)
Eosinophils Absolute: 0.3 10*3/uL (ref 0.0–0.7)
Eosinophils Relative: 4 % (ref 0–5)
Neutrophils Relative %: 62 % (ref 43–77)

## 2011-01-01 LAB — CBC
HCT: 43 % (ref 39.0–52.0)
MCHC: 33.7 g/dL (ref 30.0–36.0)
MCV: 81.5 fL (ref 78.0–100.0)
Platelets: 301 10*3/uL (ref 150–400)
RDW: 14.5 % (ref 11.5–15.5)
WBC: 7.4 10*3/uL (ref 4.0–10.5)

## 2011-01-01 LAB — URINALYSIS, ROUTINE W REFLEX MICROSCOPIC
Bilirubin Urine: NEGATIVE
Glucose, UA: NEGATIVE mg/dL
Hgb urine dipstick: NEGATIVE
Ketones, ur: NEGATIVE mg/dL
Protein, ur: NEGATIVE mg/dL
pH: 6 (ref 5.0–8.0)

## 2011-01-01 LAB — BASIC METABOLIC PANEL
BUN: 5 mg/dL — ABNORMAL LOW (ref 6–23)
CO2: 29 mEq/L (ref 19–32)
Chloride: 110 mEq/L (ref 96–112)
Creatinine, Ser: 0.84 mg/dL (ref 0.4–1.5)
Glucose, Bld: 80 mg/dL (ref 70–99)

## 2011-01-01 LAB — PROTIME-INR
INR: 1.49 (ref 0.00–1.49)
Prothrombin Time: 17.9 seconds — ABNORMAL HIGH (ref 11.6–15.2)

## 2011-01-09 LAB — PROTIME-INR
INR: 1.4 (ref 0.00–1.49)
INR: 2.3 — ABNORMAL HIGH (ref 0.00–1.49)
Prothrombin Time: 18.1 seconds — ABNORMAL HIGH (ref 11.6–15.2)
Prothrombin Time: 22.4 seconds — ABNORMAL HIGH (ref 11.6–15.2)
Prothrombin Time: 26.5 seconds — ABNORMAL HIGH (ref 11.6–15.2)

## 2011-01-10 LAB — URINALYSIS, ROUTINE W REFLEX MICROSCOPIC
Nitrite: NEGATIVE
Protein, ur: NEGATIVE mg/dL
Specific Gravity, Urine: 1.01 (ref 1.005–1.030)
Urobilinogen, UA: 0.2 mg/dL (ref 0.0–1.0)

## 2011-01-10 LAB — CBC
Hemoglobin: 13.5 g/dL (ref 13.0–17.0)
Hemoglobin: 15.5 g/dL (ref 13.0–17.0)
MCHC: 34.4 g/dL (ref 30.0–36.0)
MCHC: 35.2 g/dL (ref 30.0–36.0)
Platelets: 287 10*3/uL (ref 150–400)
Platelets: 316 10*3/uL (ref 150–400)
RDW: 13.2 % (ref 11.5–15.5)
RDW: 13.3 % (ref 11.5–15.5)

## 2011-01-10 LAB — PROTEIN S, TOTAL: Protein S Ag, Total: 84 % (ref 70–140)

## 2011-01-10 LAB — BASIC METABOLIC PANEL
BUN: 4 mg/dL — ABNORMAL LOW (ref 6–23)
CO2: 28 mEq/L (ref 19–32)
Calcium: 8.3 mg/dL — ABNORMAL LOW (ref 8.4–10.5)
Creatinine, Ser: 0.75 mg/dL (ref 0.4–1.5)
GFR calc non Af Amer: >60 mL/min (ref >60)
Glucose, Bld: 117 mg/dL — ABNORMAL HIGH (ref 70–99)

## 2011-01-10 LAB — LUPUS ANTICOAGULANT PANEL
Lupus Anticoagulant: NOT DETECTED
PTT Lupus Anticoagulant: 34.4 secs — ABNORMAL LOW (ref 36.3–48.8)

## 2011-01-10 LAB — LIPID PANEL
Total CHOL/HDL Ratio: 3.4 RATIO
VLDL: 15 mg/dL (ref 0–40)

## 2011-01-10 LAB — COMPREHENSIVE METABOLIC PANEL
Albumin: 4.1 g/dL (ref 3.5–5.2)
Alkaline Phosphatase: 97 U/L (ref 39–117)
BUN: 10 mg/dL (ref 6–23)
Calcium: 9.4 mg/dL (ref 8.4–10.5)
Glucose, Bld: 73 mg/dL (ref 70–99)
Potassium: 4.6 mEq/L (ref 3.5–5.1)
Sodium: 140 mEq/L (ref 135–145)
Total Protein: 7 g/dL (ref 6.0–8.3)

## 2011-01-10 LAB — TYPE AND SCREEN
ABO/RH(D): A POS
Antibody Screen: NEGATIVE

## 2011-01-10 LAB — CARDIOLIPIN ANTIBODIES, IGG, IGM, IGA
Anticardiolipin IgG: <7 [GPL'U] — ABNORMAL LOW (ref <11)
Anticardiolipin IgM: <7 [MPL'U] — ABNORMAL LOW (ref <10)

## 2011-01-10 LAB — PROTIME-INR
INR: 1 (ref 0.00–1.49)
INR: 1.1 (ref 0.00–1.49)
Prothrombin Time: 14.2 seconds (ref 11.6–15.2)

## 2011-01-10 LAB — BETA-2-GLYCOPROTEIN I ABS, IGG/M/A: Beta-2-Glycoprotein I IgM: <4 U/mL (ref <10)

## 2011-01-10 LAB — FACTOR 8 ASSAY: Coagulation Factor VIII: 346 % — ABNORMAL HIGH (ref 73–140)

## 2011-01-10 LAB — PROTEIN S ACTIVITY: Protein S Activity: 98 % (ref 69–129)

## 2011-01-10 LAB — HOMOCYSTEINE: Homocysteine: 8.3 umol/L (ref 4.0–15.4)

## 2011-01-16 LAB — POCT I-STAT, CHEM 8
Creatinine, Ser: 0.9 mg/dL (ref 0.4–1.5)
Glucose, Bld: 81 mg/dL (ref 70–99)
Hemoglobin: 16.3 g/dL (ref 13.0–17.0)
TCO2: 24 mmol/L (ref 0–100)

## 2011-01-16 LAB — PROTIME-INR: Prothrombin Time: 14 seconds (ref 11.6–15.2)

## 2011-02-13 NOTE — Op Note (Signed)
[name], [name]               ACCOUNT NO.:  [id number]   MEDICAL RECORD NO.:  [id number]          PATIENT TYPE:  AMB   LOCATION:  SDS                          FACILITY:  [facility]   PHYSICIAN:  Quita Skye. Hart Rochester, M.D.  DATE OF BIRTH:  1966/07/28   DATE OF PROCEDURE:  04/07/2008  DATE OF DISCHARGE:  04/07/2008                               OPERATIVE REPORT   PREOPERATIVE DIAGNOSIS:  Ischemic right leg with severe claudication.   PROCEDURE:  1. Abdominal aortogram with bilateral lower extremity runoff via left      common femoral approach.  2. Second order catheterization, right common femoral artery with      right lower extremity angiogram.   SURGEON:  Quita Skye. Hart Rochester, MD   ANESTHESIA:  Local Xylocaine and fentanyl 25 mcg.   CONTRAST:  131 mL.   COMPLICATIONS:  None.   DESCRIPTION OF PROCEDURE:  The patient was taken to the [facility]  Peripheral Endovascular Lab and placed in supine position at which time  both groins were prepped with Betadine solution and draped in routine  sterile manner.  After infiltration of 1% Xylocaine, left common femoral  artery was entered percutaneously.  Guidewire passed into the suprarenal  aorta under fluoroscopic guidance.  A 5-French sheath and dilator were  passed over the guide wire, dilator removed, and standard pigtail  catheter was positioned in the suprarenal aorta.  Flush abdominal  aortogram was performed injecting 200 mL of contrast at 20 mL per  second.  This revealed the aorta to be small in caliber but widely  patent with single widely patent renal arteries bilaterally.  Inferior  mesenteric artery was patent.  Both common internal and external iliac  arteries were patent with no significant stenoses.  Catheter was  withdrawn into the terminal aorta.  Bilateral lower extremity runoff  performed injecting 77 mL of contrast at 7 mL per second.  This revealed  the left side to have a widely patent common, superficial, and profunda  femoris artery.  The left popliteal artery was patent.  The anterior  tibia was occluded on the left with posterior tibia and peroneal being  patent and peroneal being occluded in the lower third of the leg.  On  the right side, the common femoral, profunda, and proximal superficial  femoral arteries were widely patent.  The SFA was occluded at the end of  the adductor canal above-the-knee.  Popliteal was totally occluded.  The  only good vessel to the foot was the posterior tibial artery which  reconstituted near its origin and was of good caliber.  Peroneal artery  was diffusely diseased but patent.  Following this to get better detail,  the pigtail catheter was removed over the guidewire.  Right common iliac  artery cannulated using an IMA catheter and following this an end-hole  catheter was advanced to the right common femoral level.  Additional  views were obtained using the peak hold technique to confirm the above  findings.  Following this, the catheter was removed, sheath removed,  adequate compression applied, and no complications ensued.   FINDINGS:  1. Widely patent aortoiliac system.  2. Left superficial, femoral, and popliteal vessels patent with 2-      vessel runoff through posterior tibial and peroneal artery which      was occluded above the ankle.  3. Good runoff on the right to the mid thigh where the superficial      femoral artery was occluded at the distal adductor canal with total      occlusion of popliteal artery on the right, one-vessel runoff      through posterior tibial artery which crossed the ankle into the      foot.  Peroneal artery was patent on the right but severely      diseased.      Quita Skye Hart Rochester, M.D.  Electronically Signed     JDL/MEDQ  D:  04/07/2008  T:  04/08/2008  Job:  [id number]

## 2011-02-13 NOTE — Procedures (Signed)
DUPLEX DEEP VENOUS EXAM - LOWER EXTREMITY   INDICATION:  Swollen right foot.   HISTORY:  Edema:  Yes.  Trauma/Surgery:  Left lower extremity bypass graft on 07/02/08.  Pain:  Right lower calf/foot pain.  PE:  No.  Previous DVT:  No.  Anticoagulants:  Yes.  Other:   DUPLEX EXAM:                CFV   SFV   PopV  PTV    GSV                R  L  R  L  R  L  R   L  R  L  Thrombosis    o  o  o     o     o      o  Spontaneous   +  +  +     +     +      +  Phasic        +  +  +     +     +      +  Augmentation  +  +  +     +     +      +  Compressible  +  +  +     +     +      +  Competent   Legend:  + - yes  o - no  p - partial  D - decreased   IMPRESSION:  1. No evidence of deep vein thrombosis noted in the right lower      extremity, based on limited visualization due to leg edema.  2. A hematoma is noted adjacent to the distal graft at the proximal      calf level.    _____________________________  Janetta Hora. Fields, MD   CH/MEDQ  D:  07/14/2008  T:  07/14/2008  Job:  [id number]

## 2011-02-13 NOTE — Assessment & Plan Note (Signed)
OFFICE VISIT   [name], [name]  DOB:  Oct 28, 1965                                       05/07/2008  CHART#:[id number]   The patient presents today for followup of his emergent bypass to his  right leg.  He is a 45 year old gentleman with a history of a severe  premature atherosclerotic disease in his family.  He presented to [facility] on 04/08/2008 with severe ischemia of his right leg.  He  was taken to the operating room, where he underwent at right superficial  femoral to tibioperoneal trunk bypass and thrombectomy of his popliteal  artery.  He has done well since that time.  His incisions are all  healing quite nicely.  He does have a palpable dorsalis pedis pulse and  his ankle/arm index is normal bilaterally.  I am quite pleased with his  initial recovery.  We will see him in 3 months with repeat lower  extremity graft scan at that time.  He is questioning the possibility of  cardiac evaluation.  He had multiple family members, including his  father, with severe coronary disease early in life.  I explained that,  with a strong family history of premature peripheral vascular disease  this does put him at increased risk.  He has asked that we refer him to  Dr. Viann Fish, who he knew from care of his father.  We will  arrange this consultation.  We will see him back in 3 months.   Larina Earthly, M.D.  Electronically Signed   TFE/MEDQ  D:  05/07/2008  T:  05/10/2008  Job:  [id number]   cc:   Georga Hacking, M.D.

## 2011-02-13 NOTE — Procedures (Signed)
BYPASS GRAFT EVALUATION   INDICATION:  Lower extremity bypass graft evaluation.   HISTORY:  Diabetes:  No.  Cardiac:  No.  Hypertension:  No.  Smoking:  Yes.  Previous Surgery:  Right above-knee to below-knee popliteal bypass graft  on 07/02/08 by Dr. Darrick Penna.   SINGLE LEVEL ARTERIAL EXAM                               RIGHT              LEFT  Brachial:                    106                108  Anterior tibial:             51                 122  Posterior tibial:            55                 129  Peroneal:  Ankle/brachial index:        0.51               1.19   PREVIOUS ABI:  Date: 07/14/08  RIGHT:  0.9  LEFT:   LOWER EXTREMITY BYPASS GRAFT DUPLEX EXAM:   DUPLEX:  Sonographic evidence of superficial femoral artery occlusion  from mid thigh through the bypass graft.   IMPRESSION:  1. Right ankle brachial index suggests moderate arterial disease with      monophasic Doppler waveform.  2. Normal left ankle brachial indices with triphasic Doppler waveform      on posterior tibial and monophasic on anterior tibial.       ___________________________________________  Larina Earthly, M.D.   AC/MEDQ  D:  10/29/2008  T:  10/29/2008  Job:  [id number]

## 2011-02-13 NOTE — Assessment & Plan Note (Signed)
OFFICE VISIT   [name], [name]  DOB:  08-26-1966                                       12/21/2009  CHART#:[id number]   The patient is a 45 year old male who has undergone who has undergone  multiple prior revascularizations of his left leg.  He is currently not  a candidate for any further revascularizations and has fairly severe  tibial disease in the left leg.  He has also previously undergone a  right femoral to posterior tibial bypass with vein.  This is currently  patent.   He was recently discharged from the hospital after an attempt at  salvaging his left lower extremity which was unsuccessful.  Since that  time he has also sought out a second opinion from [facility] which showed that he is unreconstructable on the left  leg.  He is currently on narcotic pain medication for relief of his rest  pain.  He returns today for further discussion of continued management  with pain medicine versus a possible amputation of his left leg.  He is  currently taking oxycodone 15 mg every 3 to 4 hours.   FAMILY HISTORY:  Remarkable for his father who had vascular at a young  age.   SOCIAL HISTORY:  He has 2 children.  He is disabled.  He currently is  still smoking 4 to 5 cigarettes per day but has cut back significantly.   REVIEW OF SYSTEMS:  A full 12-point review of systems was performed with  the patient today.  Please see intake referral form for details  regarding this.   PHYSICAL EXAMINATION:  Blood pressure 111/75 in the right arm, heart  rate 111 and regular.  Temperature is 98.2.  Left groin and calf  incisions are all well healed at this point.  Right foot is pink, warm,  well-perfused.  Left foot is also actually pink, warm and well-perfused  at this point and very similar to the right.   The patient and I had a lengthy discussion today about possible  amputation of his left leg for pain control.  He currently wishes  to  continue with pain medication for now but is seriously considering an  amputation in the near future.  I have prescribed for him today another  50 oxycodone tablets and made an agreement with him today that we would  only give him 50 of these every 2 weeks.  I have also set him up an  office visit for a pain management client to further manage his pain  issues.  In addition, he wanted to have some counseling regarding this  and I have set him up with a psychology visit with Dr. Adelene Amas  regarding this.  He will follow up with me in 2 weeks' time.     Janetta Hora. Fields, MD  Electronically Signed   CEF/MEDQ  D:  12/21/2009  T:  12/22/2009  Job:  [id number]   cc:   Adelene Amas, M.D.

## 2011-02-13 NOTE — Discharge Summary (Signed)
[name], [name] NO.:  [id number]   MEDICAL RECORD NO.:  [id number]          PATIENT TYPE:  INP   LOCATION:  2021                         FACILITY:  [facility]   PHYSICIAN:  Larina Earthly, M.D.    DATE OF BIRTH:  Aug 21, 1966   DATE OF ADMISSION:  01/25/2009  DATE OF DISCHARGE:  02/01/2009                               DISCHARGE SUMMARY   ADMISSION DIAGNOSIS:  Bilateral lower extremity arterial insufficiency.   FINAL DISCHARGE DIAGNOSES:  1. 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  2. Significantly increased factor VIII activity.  Plans for 1-2 month      followup with Dr. Cyndie Chime.  3. Gastroesophageal reflux disease.  4. Posttraumatic stress disorder with history of panic attacks.  5. Probable postoperative right groin seroma.  6. Smoking cessation.   PROCEDURES:  On January 25, 2009, right femoral to posterior tibial bypass  with translocated nonreversed saphenous vein from the left leg, left  popliteal posterior tibial and peroneal artery thrombectomy and vein  patch angioplasty of the left popliteal artery, bilateral lower  extremity arteriogram by Dr. Gretta Began.  Postoperative ABIs showing  1.23 on the right and 0.86 on the left.   CONSULTS:  1. Physical Therapy.  2. Hematology, Dr. Genene Churn. Cyndie Chime, MD.   BRIEF HISTORY:  Mr. Gullion is a 45 year old Caucasian male with complex  lower extremity arterial insufficiency.  He presented to [facility]  Emergency Room on April 08, 2008, with severe ischemia of the right leg.  He was taken to the operating room where he underwent a right  superficial femoral to tibial peroneal trunk bypass and thrombectomy of  the popliteal artery.  He initially did well, but then suffered early  occlusion.  He was readmitted in September 2009, where he underwent  attempted thrombolysis in Interventional Radiology.  This was  unsuccessful and therefore he underwent thrombectomy of bypass with  vein  patch of the distal anastomosis and thrombectomy of the tibial peroneal  vessels on July 07, 2008.  On postoperative followup, he has developed  stenosis in the bypass and was recommended that he undergo arteriography  to further evaluate.  He subsequently underwent repeat arteriogram that  were on November 04, 2008 showing occlusion of his bypass and runoff via  the right posterior tibial artery.  At that time he did not have any  imaging on his left leg.  He had had no history of left leg occlusive  disease and had maintained a normal ABI on the left.  His initial  arteriogram from July showed two-vessel runoff with a normal popliteal  artery.  Dr. Arbie Cookey had a long discussion with the patient following his  recurrent occlusion of his bypass.  He had been maintained on Coumadin,  although he had never had any proven hypercoagulable state.  Dr. Arbie Cookey  recommended that he undergo right femoral to posterior tibial artery  bypass with vein to the left leg and this was for limb salvage due to  his severe rest pain.  He reported that he was able to proceed with this  level of  procedure.  He called back on a subsequent occasion and again  bypass was recommended, but the patient reported he was on a Custody  trial and could not do it at that time.  During the week of January 21, 2009, Dr. Arbie Cookey was notified by Dr. Tora Kindred in [place] that he had  seen the patient for second opinion and he actually repeat an  arteriogram on the patient on January 18, 2009, to see if there was any  endovascular treatment option.  Although it was clearly not going to be  the case from this prior arteriogram in February.  The patient reports  he had left leg symptoms for the past 3 weeks, which were new.  Dr.  Arbie Cookey reviewed the arteriogram from [place], this showed right  popliteal artery occlusion and posterior tibial runoff.  The dictated  note from Dr. Earnestine Leys said the the anterior tibial, but Dr. Arbie Cookey  felt  this was posterior tibial runoff.  Of concern, there are arteriogram  also showed left popliteal artery occlusion.  On physical exam, he no  longer had a popliteal right or left tibial pedal pulses.  The patient  is now wished to proceed with surgery for severe rest pain in his right  leg and claudication of left leg as well.   Dr. Arbie Cookey recommended a hypercoagulable workup as well as post femoral  to posterior tibial bypass on the right and to perform a left popliteal  artery exploration and thrombectomy.   HOSPITAL COURSE:  Mr. Vanwey was electively admitted to [facility] on January 25, 2009.  He underwent the previously mentioned  procedure.  Postoperatively, he required t.i.d. morphine for pain  control.  He remained on subcu Lovenox as well as Plavix and aspirin.  He was seen by Dr. Cyndie Chime and hypercoagulable workup was initiated.  In summary, per Dr. Patsy Lager note is protein S, C and antithrombin  were normal.  Homocystine was normal.  Lupus anticoagulant was negative.  Factor V Leiden mutation was not detected factor VIII activity.  However, had significant increase of 346.  It was felt likely component  of acute inflammation from surgery, but recommended that this be  repeated in 1-2 months as outpatient.  He also recommended that once  therapeutic on Coumadin that his Plavix be discontinued and he had  issues on baby aspirin and Coumadin.  Though the patient had been on  Coumadin in the past, which has been followed by Dr. Viann Fish.  However, he had recently relocated to [facility] and wished to have his  Coumadin to follow with somewhere closer to where he lived.  Dr.  Cyndie Chime agreed that the cancer center could follow this until the  patient was established with the family physician in [place].  INR  was therapeutic at 2.4 at discharge.  Regards to his surgery, I will see  his incisions are stayed clean, dry and intact without evidence  of  infection.  He did have some what appeared to be a right groin seroma  postoperatively less likely a hematoma.  It was felt that this could be  followed in outpatient basis and has no improvement in followup.  Dr.  Arbie Cookey would attempt needle aspiration in the office and determined  further treatment at that time.  He was weaned from his PCA by discharge  and pain was controlled on Percocet 10/325 mg p.r.n.  He is ambulating  with the use of rolling walker.  He had dorsalis  pedis and posterior  tibial Doppler signals with ABIs as previously mentioned.  He remained  hemodynamically stable, afebrile, and saturation 94% on room air.  His  labs showed as the cholesterol of 163, triglyceride 77, HDL 48, LDL 100,  VLDL 15, total cholesterol to HDL ratio 3.4.  Sodium 136, potassium 3.6,  glucose 117, creatinine 0.75, white count of 11.4, hemoglobin 13.5,  hematocrit 38.5, and platelet count 287.  Hypercoagulable workup summary  as previously discussed.  On the Feb 01, 2009, Mr. Bloyd was felt  appropriate for discharge home.  His INR was therapeutic and pain was  controlled ambulating without difficulty.  He was felt to be in stable  and improving condition at discharge.   DISCHARGE MEDICATIONS:  1. Protonix 40 mg p.o. daily.  2. Coumadin 7.5 mg p.o. daily and as directed.  3. Percocet 10/325 mg 1-2 tablets p.o. q.4-6 h. p.r.n. pain.  4. Coated aspirin 81 mg p.o. daily.  5. He had been previously on Zantac 150 mg p.o. daily, which was      placed on hold since Protonix was started, which was placed on      hold.  Once his Protonix was initiated (The patient reported Zantac      did not controlled reflux symptoms).   DISCHARGE INSTRUCTIONS:  He continue a heart-healthy diet and increase  activity as tolerated, avoid driving, or heavy lifting for the next few  weeks.  Clean incisions gently with soap and water.  Apply gauze  dressings to his incisions daily as needed for drainage.  See Dr.  Arbie Cookey  in approximately 3 weeks with ABIs.  He should call sooner if any signs  of infection, fever, or increased pain.  He will follow up with Dr.  Cyndie Chime.  Dr. Cyndie Chime will see  him in 1-2 months.  In the meantime, the Specialty Clinic in the cancer  center at [facility] will contact him regarding getting a  PT/INR on Feb 03, 2009 or Feb 04, 2009.  The patient was instructed to  call [phone], if he has not heard from the clinic, but at time he  understands he is to find a family physician in [place].      Jerold Coombe, P.A.      Larina Earthly, M.D.  Electronically Signed    AWZ/MEDQ  D:  02/01/2009  T:  02/02/2009  Job:  [id number]   cc:   Georga Hacking, M.D.  Genene Churn. Cyndie Chime, M.D.  Larina Earthly, M.D.

## 2011-02-13 NOTE — H&P (Signed)
HISTORY AND PHYSICAL EXAMINATION   January 21, 2009   Re:  [name], [name]               DOB:  18-Jul-1966   ADMISSION DIAGNOSIS:  Bilateral lower extremity arterial insufficiency.   HISTORY OF PRESENT ILLNESS:  The patient is a very complex 45 year old  gentleman with lower extremity arterial insufficiency.  He initially  presented to [facility] emergency room on 04/08/2008 with severe  ischemia of his right leg.  He was taken to the operating room where he  underwent a right superficial femoral to tibioperoneal trunk bypass and  thrombectomy of his popliteal artery.  He initially did well but then  suffered early occlusion.  He had was readmitted in September 2009 where  he underwent attempted thrombolysis in interventional radiology, this  was successful and he, therefore, underwent thrombectomy of the bypass  with a vein patch of the distal anastomosis and thrombectomy of the  tibioperoneal vessels on October 7th.  On postoperative follow-up he  developed stenosis in the bypass and it was recommend that he undergo  arteriography for further evaluation.  He subsequently underwent repeat  arteriogram on November 04, 2008, showing occlusion of his bypass and  runoff via the right posterior tibial artery.  At that time he did not  have any imaging of his left leg, he did not have any history of left  leg occlusive disease and has maintained a normal ankle arm index on the  left.  His initial arteriogram from July showed two-vessel runoff with a  normal popliteal artery.  I had a long discussion with the patient  following his recurrent occlusion of his bypass.  He had been maintained  on Coumadin, although he never had any proven hypercoagulable state.  I  recommended that he undergo a right femoral to posterior tibial bypass  with vein from the left leg and this was for limb salvage due to his  severe rest pain.  He reported that he was unable to proceed with this  level of procedure.  He has called back on a subsequent occasion, I  again recommended bypass and he said that he in a custody trial and  could not do it at that time.  I was notified earlier in this week from  Dr. Tora Kindred, in [place], that he had seen the patient for a second  opinion, he actually repeated an arteriogram on the patient on January 18, 2009, to see if there was any endovascular treatment option, although  this clearly was not going to be the case from his prior arteriogram in  February.  The patient reports that he has had left leg symptoms for the  past 3 weeks which are new.  I have the angiogram for review from  [place] April 20th.  This shows right popliteal occlusion and posterior  tibial runoff.  The dictated note from Dr. Earnestine Leys says anterior tibial,  but this is clearly posterior tibial runoff.  Of concern, the  arteriogram from April 20th shows left popliteal artery occlusion.  On  physical exam, he no longer has palpable right or left tibial pedal  pulses.  The patient now wishes to proceed with surgery for severe rest  pain in his right leg pain, he is having significant claudication in the  left leg as well.  I again discussed this at great length with the  patient and his family present.  I explained that with these types of  recurrent issues in a 45 year old that he is certainly at high risk for  continued recurrent ischemic problems.  I explained that he currently  does not have adequate flow for limb salvage on the right and would  recommend a right femoral to posterior tibial bypass.  I explained that  the situation is now complicated by his left leg ischemia, although this  I do not feel is currently limb-threatening.  I explained that his best  option for the right leg would be saphenous vein from the left leg as  the highest chance for long-term patency in a femoral posterior tibial  bypass.  I have recommended that we proceed with a  hypercoagulable  workup.  I do not feel that it is warranted to defer this and we can  obtain this while he is in the hospital.  He will be admitted on April  27th for right femoral posterior tibial bypass with vein from the left  leg.  I also explained that we would explore his left popliteal artery  at the same time to see if thrombectomy is possible since this has only  been 3 weeks since he developed symptoms and did have documented normal  flow in his left foot by noninvasive studies of March 18th.   PAST MEDICAL HISTORY:  Hiatal hernia with gastroesophageal reflux, post-  traumatic stress disorder with panic attacks.   ALLERGIES:  None.   MEDICATIONS:  1. Plavix 75 mg daily, stopped 2 weeks ago.  2. Lyrica 75 mg.   PHYSICAL EXAMINATION:  A well-developed, well-nourished white male  appearing his stated age 6.  Blood pressure is 116/84, pulse 101,  respirations 18.  His radial pulses are 2+, he has 2+ femoral pulses, he  has absent popliteal and distal pulses bilaterally.  He has no tissue  loss, although he does have an ingrown toenail on his right great toe  which is not infected.   IMPRESSION:  Complex bilateral peripheral vascular occlusive disease.   PLAN:  The patient will be admitted for right leg femoral to posterior  tibial bypass and left popliteal thrombectomy.   Larina Earthly, M.D.  Electronically Signed   TFE/MEDQ  D:  01/21/2009  T:  01/24/2009  Job:  [id number]

## 2011-02-13 NOTE — Assessment & Plan Note (Signed)
OFFICE VISIT   [name], [name]  DOB:  08-19-1966                                       10/29/2008  CHART#:[id number]   The patient presents today for continued follow-up of his right leg  occlusive disease.  He has an increasingly complex peripheral vascular  history.  He presented initially with severe ischemia of his right leg  and underwent right superficial femoral artery tibioperoneal trunk  bypass with thrombectomy of his right popliteal artery by myself on  04/08/2008.  He did well initially and had normal return of flow.  He  then presented to our office on 06/23/2008 with recurrent ischemia and  no audible flow in his foot with an occluded bypass.  He underwent  attempted thrombolysis and subsequently underwent thrombectomy of his  right above-knee to below-knee popliteal bypass and vein patch  angioplasty of his distal anastomosis and thrombectomy of his tibial  vessels and popliteal endarterectomy by Dr. Darrick Penna on 07/02/2008.  He  had been followed in our noninvasive vascular lab and was here today for  routine noninvasive follow-up.  On graft scanning he was found to have a  drop in his ankle arm index back down to 0.5 on the right.  His bypass  is patent.  He has a patent graft but has occluded his native  superficial femoral artery proximal to this.  He does have moderate to  severe claudication but no rest pain.  On physical exam, his foot is  well-perfused without any evidence of tissue loss.  He has been  maintained on chronic Coumadin since his thrombectomy in October.  He  does have a right have 2+ femoral pulse.  The patient is obviously  frustrated with his recurrent occlusions, as am I.  I explained that  with his occluded inflow that I feel this puts his graft at risk which  puts him at risk for limb threatening ischemia.  I have recommended that  we discontinue his Coumadin and that we would do an outpatient  arteriogram next week  for better visualization of this.  He is scheduled  for outpatient arteriography on 11/04/2008 at [facility] and we  will make further recommendations pending this.  He reports that he is  extremely anxious related to all of this and is requesting a  prescription for Xanax.  I have given him a prescription of 0.5 mg 20  one p.o. t.i.d. p.r.n. with no refills.   Larina Earthly, M.D.  Electronically Signed   TFE/MEDQ  D:  10/29/2008  T:  11/01/2008  Job:  [id number]

## 2011-02-13 NOTE — Op Note (Signed)
NAMERIGGIN, CUTTINO               ACCOUNT NO.:  [id number]   MEDICAL RECORD NO.:  [id number]          PATIENT TYPE:  AMB   LOCATION:  SDS                          FACILITY:  [facility]   PHYSICIAN:  Juleen China IV, MDDATE OF BIRTH:  March 28, 1966   DATE OF PROCEDURE:  11/04/2008  DATE OF DISCHARGE:  11/04/2008                               OPERATIVE REPORT   PREOPERATIVE DIAGNOSIS:  Right leg rest pain.   POSTOPERATIVE DIAGNOSIS:  Right leg rest pain.   PROCEDURE PERFORMED:  1. Ultrasound-guided access, left common femoral artery.  2. Abdominal aortogram.  3. Right lower extremity runoff.  4. Second-order catheterization.   HISTORY:  This is a 45 year old gentleman with extensive vascular  history.  He has undergone distal revascularization, which occluded  early on.  Thrombolysis was performed and underwent revision of his  above-knee to below-knee bypass graft with vein patch angioplasty of  distal anastomosis.  He comes in with recurrence of his symptoms and  then decrease in his ankle-brachial index.   PROCEDURE:  The patient was identified in the holding area and taken to  room 7.  He was placed supine on the table.  Bilateral groins were  prepped and draped in standard sterile fashion.  Time-out was called.  The left femoral arteries divided with ultrasound and found be widely  patent.  It was accessed after being anesthetized with 1% lidocaine  using an 18-gauge needle under ultrasound guidance.  An 0.035 Bentson  wire was advanced in retrograde fashion in the abdominal aorta under  fluoroscopic visualization.  A 5-French sheath was placed.  Over the  wire, Omni flush catheter was placed.  The level of L1 and abdominal  aortogram was obtained.  Next, using Omni flush catheter and a Bentson  wire, the aortic bifurcation was crossed.  Catheters were placed in the  right external iliac artery and right leg runoff was obtained.   FINDINGS:  Aortogram:  Visualized portions of  suprarenal abdominal aorta  showed minimal disease.  There is single renal arteries bilaterally,  which are widely patent.  There is minimal disease within the infrarenal  abdominal aorta.  Pelvic angiogram:  The left common iliac artery is  widely patent with minimal disease, left external iliac artery is widely  patent with minimal disease.  Left internal iliac artery is widely  patent with minimal disease.  The right common iliac artery is widely  patent with minimal disease.  The right external iliac artery is widely  patent with minimal disease.  The right internal iliac artery is widely  patent with minimal disease.   Right leg:  The right common femoral artery is widely patent with  minimal disease.  The right profunda femoral artery is widely patent.  The right superficial femoral artery is patent proximally, however, it  occludes in the upper thigh.  Multiple collaterals were seen in this  region both from the profunda and the superficial femoral artery.  There  was reconstitution of the posterior tibial artery, which is his only  runoff vessel.  The posterior tibial artery does cross the ankle  and  fills the plantar arch.  No bypass graft is visualized.   After the above images were obtained, decision was made to terminate the  procedure.  Catheters and wires were removed.  The patient was taken to  the sheath holding area for sheath pull.   IMPRESSION:  1. No evidence of aortoiliac disease.  2. Occluded right superficial femoral artery.  3. Occluded right lower extremity bypass graft.  4. Single-vessel runoff via the posterior tibial artery.           ______________________________  V. Charlena Cross, MD  Electronically Signed     VWB/MEDQ  D:  11/04/2008  T:  11/04/2008  Job:  [id number]

## 2011-02-13 NOTE — Procedures (Signed)
BYPASS GRAFT EVALUATION   INDICATION:  Follow-up evaluation of lower extremity bypass graft.   HISTORY:  Diabetes:  No.  Cardiac:  No.  Hypertension:  Yes.  Smoking:  Yes.  Previous Surgery:  Right femoral to posterior tibial artery bypass  graft, left popliteal, posterior tibial artery, and peroneal  thrombectomy on 01/25/2009, by Dr. Arbie Cookey.   SINGLE LEVEL ARTERIAL EXAM                               RIGHT              LEFT  Brachial:                    103                113  Anterior tibial:             134                101  Posterior tibial:            148                86  Peroneal:  Ankle/brachial index:        1.31               0.89   PREVIOUS ABI:  Date:  RIGHT:  LEFT:   LOWER EXTREMITY BYPASS GRAFT DUPLEX EXAM:   DUPLEX:  1. Triphasic and biphasic duplex waveform noted in right femoral to      posterior tibial artery bypass graft and native arteries.  2. Triphasic and biphasic duplex waveform noted in left native      arteries.   IMPRESSION:  1. Patent right femoral to posterior tibial artery bypass graft with      no evidence of focal stenosis.  2. Patent left popliteal, posterior tibial and peroneal artery with no      evidence of focal stenosis.  3. Normal right ABI with biphasic and monophasic Doppler waveform.  4. Left ABI suggests mild arterial disease with triphasic and      monophasic Doppler waveform.       ___________________________________________  Larina Earthly, M.D.   AC/MEDQ  D:  04/08/2009  T:  04/08/2009  Job:  [id number]

## 2011-02-13 NOTE — H&P (Signed)
[name], [name]               ACCOUNT NO.:  [id number]   MEDICAL RECORD NO.:  [id number]          PATIENT TYPE:  AMB   LOCATION:  SDS                          FACILITY:  [facility]   PHYSICIAN:  Larina Earthly, M.D.    DATE OF BIRTH:  29-Sep-1966   DATE OF ADMISSION:  04/07/2008  DATE OF DISCHARGE:                              HISTORY & PHYSICAL   CHIEF COMPLAINT:  Right lower extremity claudication.   HISTORY OF PRESENT ILLNESS:  This is a very pleasant 45 year old man  with increasing symptoms consistent with claudication and right lower  extremity ischemia over the past several months.  He was referred to Dr.  Arbie Cookey, who evaluated him and recommended an aortogram with run-off in  his lower extremities.  This was performed today by Dr. Hart Rochester and he  was found to have right distal SFA/popliteal occlusion with normal run-  off via his posterior tibial branch.  Dr. Arbie Cookey felt that he should  proceed with femoral to popliteal bypass, below knee popliteal bypass.  [name] was informed of the risks and benefits of the procedure and  is desirous of proceeding with surgery.   PAST MEDICAL HISTORY:  Is consistent with :  1. Hiatal hernia with gastroesophageal reflux disease.  2. Post-traumatic stress disorder.  3. Panic attacks.   He has no allergies.   ADMISSION MEDICATIONS:  Consist of lisinopril, hydrochlorothiazide,  Xanax, Protonix, Tylox.   FAMILY HISTORY:  Consistent with hyperlipidemia with history of  mycoardial ischemic disease in his father.   SOCIAL HISTORY:  He continues to smoke.   REVIEW OF SYSTEMS:  A 10 system review was performed, which was negative  with the exception of the claudication in his right lower extremity,  which he defines as a 12 out of 10 discomfort.  He continues to have  pain at rest.   PHYSICAL FINDINGS:  Revealed a very pleasant, 45 year old man who  appeared older than his stated age.  Temperature was 97.6, heart rate  87, respiratory  rate 18, blood pressure 107/60.  O2 saturation was 98%.  His height was 5 feet 10 inches.  His weight was 90.9 kilograms.  HEENT:  Normocephalic, atraumatic.  PERRLA.  EOMI.  Sclerae was  nonicteric.  Mucous membranes were pink and moist.  Dentition was in  good order.  NECK:  Exhibited a full range of motion.  His trachea was midline.  His  lungs were clear to auscultation.  CARDIAC EXAM:  Revealed a regular rate and rhythm, and was without  murmurs, rubs or gallops.  ABDOMINAL EXAM:  Entirely benign.  Abdomen was soft, nontender,  nondistended.  Bowel sounds were present in all quadrants.  There was no  hepatosplenomegaly or other palpable pulsatile masses.  EXTREMITIES:  Demonstrated a full range of motion.  Grip strengths were  equal.  His right leg was markedly cooler than his left leg.  Peripheral  pulses were radially bilaterally 2+.  DP on the right was not palpable.  DP on the left was palpable.  NEUROLOGICAL EXAM:  Nonfocal, although he did have diminished sensation  in  his left lower extremity.  His mood was appropriate to the situation.  SKIN:  His skin was intact and there were no obvious lesions or rashes.   IMPRESSION:  This is a 45 year old gentleman who does appear older than  his stated age with severe claudication symptoms in his right lower  extremity.  An aortogram with run-off did demonstrate significant  occlusive disease in his superficial femoral artery.   PLAN:  He will be discharged home today and readmitted in the morning  for a femoral to popliteal below knee popliteal bypass procedure to be  performed by Dr. Arbie Cookey.      Wilmon Arms, PA      Larina Earthly, M.D.  Electronically Signed    KEL/MEDQ  D:  04/07/2008  T:  04/07/2008  Job:  [id number]   cc:   Larina Earthly, M.D.

## 2011-02-13 NOTE — Assessment & Plan Note (Signed)
OFFICE VISIT   HAVEN, FOSS  DOB:  1966-10-01                                       01/04/2010  CHART#:[id number]   The patient returns for followup today.  He was last seen on 12/21/2009.  He has unreconstructable occlusive disease in his left lower extremity.  He presents today still complaining of pain in his left foot.  He was  previously scheduled for a pain management appointment as well as to see  a psychologist regarding the possibility of improving his pain control  and also consider whether or not we should do an amputation of his leg.  Unfortunately he still continues to smoke.  He states the pain in his  left foot is essentially continuous and he has not slept in several  days.   PHYSICAL EXAM:  Vital signs:  Blood pressure is 124/85 in the left arm,  heart rate is 106 and regular, respirations 20, bilateral feet are pink,  warm and well-perfused.   I again discussed with the patient today smoking cessation.  He does not  really seem interested in this.  He was given a renewal today for his  oxycodone prescription to last for three more weeks.  He was given 80  tablets not to be renewed and not to have any further prescriptions  until he sees me back in 3 weeks.  He was also given a one time  prescription for Ambien 5 mg today for sleep.  We will not renew either  of these prescriptions after his next office visit as he should have had  his psychology and his pain management clinic scheduled by then.     Janetta Hora. Fields, MD  Electronically Signed   CEF/MEDQ  D:  01/04/2010  T:  01/05/2010  Job:  [id number]

## 2011-02-13 NOTE — Procedures (Signed)
BYPASS GRAFT EVALUATION   INDICATION:  Right foot pain and coldness status post right SFA to  tibioperoneal trunk bypass graft.   HISTORY:  Diabetes:  No.  Cardiac:  No.  Hypertension:  No.  Smoking:  Yes.  Previous Surgery:  Right superficial femoral artery to tibioperoneal  trunk bypass.   SINGLE LEVEL ARTERIAL EXAM                               RIGHT              LEFT  Brachial:                    126                123  Anterior tibial:             0                  142  Posterior tibial:            0                  137  Peroneal:  Ankle/brachial index:        0                  1.13   PREVIOUS ABI:  Date:  05/07/2008  RIGHT:  1.10  LEFT:  1.18   LOWER EXTREMITY BYPASS GRAFT DUPLEX EXAM:   DUPLEX:  Occluded right femoral to tibioperoneal trunk bypass graft.   IMPRESSION:  1. Occluded right femoral to tibioperoneal trunk bypass graft.  2. No flow noted in the right DPA and PTA.  3. Normal ABI with biphasic Doppler waveform noted in the right leg.   ___________________________________________  Janetta Hora. Fields, MD   MG/MEDQ  D:  06/23/2008  T:  06/23/2008  Job:  [id number]

## 2011-02-13 NOTE — Assessment & Plan Note (Signed)
OFFICE VISIT   VIVEK, GREALISH  DOB:  10-19-1965                                       12/17/2008  CHART#:[id number]   The patient presents today for continued discussion of his right leg  ischemia.  He had undergone arteriography as an outpatient in  preparation for revascularization on 11/04/2008.  He elected to defer  this and was walking with his moderate to severe claudication.  He  reports over the past several days he has had a progression to rest pain  in the toes of his right foot.  Ankle arm index on 12/16/2008 revealed  the ankle arm index to be 0.42, down slightly from 0.51 on the right  from his study in January.  He continues to have normal ankle arm index  and normal wave forms on the left.  I again reviewed his initial  arteriogram and his most recent arteriogram.  The initial study was from  03/2008 when he initially presented with acute ischemia of his right  foot.  At that time his superficial femoral artery was patent throughout  its course, he did have an occluded popliteal artery with runoff only  through his posterior tibial artery.  He subsequently underwent a  superficial femoral artery to tibioperoneal trunk bypass by me, he  suffered an early failure and had a thrombectomy by Dr. Darrick Penna.  He now  has had recurrent occlusion.  His most recent arteriogram shows  narrowing throughout his superficial femoral artery and continued runoff  only through the posterior tibial.  I have recommended that he undergo  right femoral to posterior tibial bypass with saphenous vein harvested  from his left leg.  I explained that prosthetic alternative would have  an extremely poor patency, particularly in light of his multiple early  recurrences.  I explained that this is a very early onset of this type  of problem.  He is continuing on Coumadin therapy and will need to stop  this prior to surgery.  He was given a prescription for Tylox #30, no  refills.  He reports that he is unwilling and unprepared to undergo this  magnitude of surgery.  I explained that if he does not have tissue loss  and rest pain only that continued observation is appropriate for a short  period of time until he is ready to proceed with surgery.   Larina Earthly, M.D.  Electronically Signed   TFE/MEDQ  D:  12/17/2008  T:  12/17/2008  Job:  [id number]

## 2011-02-13 NOTE — Consult Note (Signed)
[name], KLOTH NO.:  [id number]   MEDICAL RECORD NO.:  [id number]          PATIENT TYPE:  INP   LOCATION:  2021                         FACILITY:  [facility]   PHYSICIAN:  [name] SingletonDATE OF BIRTH:  11/15/1965   DATE OF CONSULTATION:  01/26/2009  DATE OF DISCHARGE:  01/07/2009                                 CONSULTATION   REASON FOR CONSULTATION:  A 45 year old man with complex bilateral  peripheral vascular occlusive disease, on chronic warfarin, rule out  hypercoagulability.   PAST MEDICAL HISTORY:  1. GERD with hiatal hernia, EGD performed by Dr. Claudette Head in      2005, also revealed mild gastritis.  2. Cholelithiasis with biliary colic, status post lap cholecystectomy      by Dr. Abbey Chatters in June 2005.  3. Posttraumatic stress disorder with panic disorder.  4. Peripheral vascular disease.      a.     Ishemic, right lower extremity in July 2009, status post       right superficial femoral to tibioperoneal trunk bypass and right       popliteal artery thrombectomy by Dr. Arbie Cookey.      b.     Early reocclusion of right lower extremity in September       2009, status post thrombectomy and revision of right above knee to       below knee popliteal bypass, vein patch angioplasty of distal       anastomosis and thrombectomy of tibial vessels in October 2009 by       Dr. Darrick Penna.      c.     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  The       patient unable to undergo salvage bypass for social reasons.      d.     Left popliteal artery occlusion identified on arteriogram,       January 18, 2009.  Repeat right sided arterial bypass and new left       arterial bypass surgery this admission.   ALLERGIES:  SERTRALINE and PAROXETINE.   MEDICATIONS:  1. Plavix 75 mg p.o. daily, started by Dr. Tora Kindred at [facility].  2. Lyrica 75 mg p.o. daily.   SOCIAL HISTORY:  The patient lives with a roommate in [place].  He is  divorced  from his first marriage.  He has a 32 year old daughter and  live in [place] with her mother.  He was in a long-term relationship for  13 years with a woman from whom he is now separated since last year.  They have a 59 year old daughter and a 24 year old son that are living  with their mother in [place].  The patient was in the [employer]  and he traveled to several countries including the [place].  He has  been disabled for the last 7 years because of a panic disorder.  He has  been smoking for the last 25 years at least 1 pack per day.  He is now  down to 4-5 cigarettes daily.  He does not currently drink alcohol  because  it used to get him in trouble.  He denies any drug use.  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   HISTORY OF PRESENT ILLNESS:  [name] is a 45 year old man with  complex peripheral arterial vascular disease who underwent a first right  lower extremity bypass in July 2009, which reoccluded and required  repeat intervention in September 2009.  He then restenosed leading to an  elective admission on January 25, 2009, by Dr. Arbie Cookey for right fem-pop  tibial bypass with translocated nonreversed saphenous vein from the left  and a left popliteal posterior tibial, and popliteal artery thrombectomy  with vein patch.  The patient requested a Hematology consult because he  is concerned that he might have an inheritied clotting disorder  predisposing him to clotting.  The patient's father had an open heart  surgery at age 45 for what sounds like valve disease.  Despite being on  warfarin, the patient's father required a CABG at age 69/60 and a right  lower extremity bypass at unknown age.  In addition, the patient's  paternal grandfather died at age 54 of an MI and his paternal uncle died  at age 65 of an MI as well.  The patient has not had any other clotting  events than  above described nor he ever had any bleeding issues in the  past (he has had surgery before).  He is a smoker, but does not take any  over-the-counter medications or supplements that could predispose him to  clotting.  He was on warfarin after his second surgery in October 2009,  which he discontinued on January 10, 2009, when he saw Dr. Tora Kindred at  [facility] for a second opinion.  At that time, Dr. Earnestine Leys switched him to  Plavix.  The patient complains that warfarin made him feel like a fish  in a fishbowl, it made him feel weird.  He is not diabetic.  He has no known hyperlipidemia but there is no  lipid panel recorded in the available chart record.   FAMILY HISTORY:  See HPI.  His mother is 39 and healthy.  He has a  healthy sister.  He has 3 children aged 38, 5, and 36, which are all  healthy.  As far as he knows, there is no family history of DVT, PE,  bleeding diathesis or diabetes.   PHYSICAL EXAMINATION:  VITAL SIGNS:  Temperature 99, blood pressure  126/66, respiratory rate 13, heart rate 82-102, and O2 sat 99% on room  air.  GENERAL:  Middle-aged man sitting in chair, listening to music on his  laptop.  EYES:  PERRLA.  EOMI.  Anicteric sclerae.  No injection  ENT:  Oropharynx clear.  Mucous membranes dry.  NECK:  Supple.  No lymphadenopathy, thyromegaly, or carotid bruit, but  carotid pulse is difficult to palpate especially on the left.  CARDIOVASCULAR:  Regular rate and rhythm.  No murmurs, rubs, or gallops.  Radial and cubital pulses 2+ bilaterally.  RESPIRATORY:  Lungs are clear to auscultation bilaterally with good air  moment.  ABDOMEN:  Bowel sounds positive.  Abdomen soft, nontender, and  nondistended.  No palpable masses or organomegaly  EXTREMITIES:  Bilateral femoral access site tender but not bleeding.  There is no femoral murmur noticed.  He has 1-2+ edema up to the knee on  the right lower extremity.  His right foot is more erythematous  and warm  than the  left foot.  I was unable to palpate his pedal or posterior  tibial pulses on either side.  NEUROLOGIC:  The patient is awake, alert, and oriented x3.  He describes  himself as unique.  He moves all 4 extremities.  His strength is  intact but decreased at the lower extremity secondary to pain.  Sensitivity to light touch is normal and symmetrical.   LABORATORY DATA:  From January 26, 2009, white blood count 11.4,  hemoglobin 13.5, MCV 84, RDW 13.2, hematocrit 38.5, and platelets 287.  Sodium 136, potassium 3.6, chloride 102, bicarb 28, BUN 4, creatinine  0.75, glucose 117, and calcium 8.3.  From January 24, 2009, PT 14.2, PTT  30, total bili 0.5, alk phos 97, AST 20, ALT 20, total protein 7, and  albumin 4.1.   ASSESSMENT AND PLAN:  Complex bilateral recurrent peripheral arterial  vascular occlusive disease.  This is most likely secondary to native vessel disease with a strong  familial component complicated by his lifelong smoking history.  A  congenital or acquired coagulopathy is unlikely, however, we will screen  him for this.  A number of inherited or acquired conditions can predispose to arterial  emboli:  Hyperhomocysteinemia, factor V Leiden mutation (usually only  venous events), antiphospholipid antibody syndrome (most common acquired  coagulopathy associated with arterial events) , and elevated factor VIII  activity.  Protein S, C, antithrombin deficiency, and the prothrombin  gene mutation usually do not predispose to arterial events in peripheral  arterial distributions but we will screen him for these to be complete.    Other considerations would be a familial dyslipidemia syndrome and  Buergher's Syndrome.  The pathology on blood vessels from prior interventions was not  reported, which could have helped [place] rule out Buerger disease. Large  vessel involvement seen in his case argues against this syndrome.  Recommendation:  At this point, although a hypercoagulable workup is  appropriate,  regardless of results,prolonged anticoagulation with warfarin and a baby  aspirin daily is indicated.    We will screen him for hyperlipidemia.    Dr. Cyndie [name] will follow up with the patient on an outpatient basis  at his office located at the [facility] to  review outstanding labs.      [name], M.D.  Electronically Signed      [name], M.D.  Electronically Signed    MC/MEDQ  D:  01/26/2009  T:  01/27/2009  Job:  [id number]   cc:   Larina Earthly, M.D.

## 2011-02-13 NOTE — Procedures (Signed)
LOWER EXTREMITY ARTERIAL EVALUATION-SINGLE LEVEL   INDICATION:  Known right leg bypass graft occlusion.  The patient has  had rest pain for 3-4 days.  At time of discovery of right leg bypass  graft occlusion the patient complained of claudication.   HISTORY:  Diabetes:  No.  Cardiac:  No.  Hypertension:  No.  Smoking:  Yes.  Previous Surgery:  A known occlusion of right above knee to below knee  pop bypass graft which was placed on 07/02/2008 by Dr. Darrick Penna.   RESTING SYSTOLIC PRESSURES: (ABI)                          RIGHT                LEFT  Brachial:               110                  108  Anterior tibial:        20                   126  Posterior tibial:       46 (0.42)            122 (>1.0)  Peroneal:  DOPPLER WAVEFORM ANALYSIS:  Anterior tibial:        Dampened monophasic  Triphasic  Posterior tibial:       Dampened monophasic  Triphasic  Peroneal:   PREVIOUS ABI'S:  Date:  10/29/2008  RIGHT:  0.51  LEFT:  >1.0   IMPRESSION:  1. Right ABI stable compared to previous study suggests moderate to      severe arterial occlusive disease.  2. Left ABI and Doppler arterial waveform suggests no significant      arterial occlusive disease.       ___________________________________________  Larina Earthly, M.D.   MC/MEDQ  D:  12/16/2008  T:  12/16/2008  Job:  [id number]

## 2011-02-13 NOTE — H&P (Signed)
HISTORY AND PHYSICAL EXAMINATION   June 23, 2008   Re:  [name], [name]               DOB:  1965/11/17   ADMISSION DIAGNOSIS:  Recurrent ischemia of right foot.   The patient presents today to our office on 09/23 with severe ischemia  of his right foot.  This has been present for approximately 3 days.  He  is status post right superficial femoral to tibioperoneal trunk bypass  with translocated nonreversed saphenous vein and thrombectomy of his  right popliteal artery on 04/08/2008.  He had an unusual presentation at  that time with ischemia and an included popliteal artery.  He had been  followed in our office and had had healing of all his wounds and had a  normal flow in his right foot.  He presents today with a cold, painful  discolored right foot.  He reports this has been present for 2-3 days  and has pain since that time.  His physical examination is otherwise  unchanged.  He does have a popliteal and left popliteal pulse.   ALLERGIES:  None.   CURRENT MEDICATIONS:  He has stopped taking all of his meds including  aspirin.   PHYSICAL EXAMINATION:  A well-developed, well-nourished white male  appearing older than his stated age of 51.  His left femoral pulses are  2+ bilaterally.  He has absent popliteal and distal pulses on the right.  Heart:  Regular rate and rhythm.  Chest:  Is clear.   I discussed the situation with the patient and his family present.  I  have recommended that he be admitted tomorrow morning on 09/24 for  arteriography and possible thrombolysis versus surgery.  He understands  this and will be admitted through the hospital tomorrow.  He will go to  interventional radiology and I have discussed this with the  interventional radiology department as well.   Larina Earthly, M.D.  Electronically Signed   TFE/MEDQ  D:  06/23/2008  T:  06/24/2008  Job:  [id number]

## 2011-02-13 NOTE — Procedures (Signed)
LOWER EXTREMITY ARTERIAL EVALUATION-SINGLE LEVEL   INDICATION:  Right leg pain.   HISTORY:  Diabetes:  No.  Cardiac:  No.  Hypertension:  No.  Smoking:  Yes.  Previous Surgery:  Thrombectomy and the revision of right above-knee to  below-knee popliteal artery bypass graft on 07/02/2008.   RESTING SYSTOLIC PRESSURES: (ABI)                          RIGHT                LEFT  Brachial:               98                   98  Anterior tibial:        88 (0.9)             Not done  Posterior tibial:       68                   Not done  Peroneal:  DOPPLER WAVEFORM ANALYSIS:  Anterior tibial:        Biphasic  Posterior tibial:       Monophasic  Peroneal:   PREVIOUS ABI'S:  Date:  06/23/08  RIGHT:  0  LEFT:  1.13   IMPRESSION:  1. Mildly decreased right ankle brachial index noted.  2. The left ankle pressures were not obtained, per Dr. Darrick Penna'      request.   ___________________________________________  Janetta Hora. Fields, MD   CH/MEDQ  D:  07/14/2008  T:  07/14/2008  Job:  [id number]

## 2011-02-13 NOTE — Op Note (Signed)
[name], [name] NO.:  [id number]   MEDICAL RECORD NO.:  [id number]          PATIENT TYPE:  INP   LOCATION:  3313                         FACILITY:  [facility]   PHYSICIAN:  Larina Earthly, M.D.    DATE OF BIRTH:  March 18, 1966   DATE OF PROCEDURE:  01/25/2009  DATE OF DISCHARGE:                               OPERATIVE REPORT   PREOPERATIVE DIAGNOSIS:  Bilateral lower extremity arterial  insufficiency.   POSTOPERATIVE DIAGNOSIS:  Bilateral lower extremity arterial  insufficiency.   PROCEDURES:  1. Right femoral-to-posterior tibial bypass with translocated      nonreversed saphenous vein from the left leg.  2. Left popliteal, posterior tibial, and peroneal artery thrombectomy      and vein patch angioplasty of left popliteal artery.  3. Bilateral lower extremity arteriograms.   SURGEON:  Larina Earthly, MD   ASSISTANT:  [name] Arms, PA-C.   ANESTHESIA:  General endotracheal.   COMPLICATIONS:  None.   DISPOSITION:  To recovery room, stable.   INDICATION OF PROCEDURE:  The patient is a very complicated 45 year old  gentleman with severe premature atherosclerotic disease.  He had  initially presented in May 2009 with severe ischemia of his right leg.  He underwent arteriography at that time revealing a popliteal artery  occlusion on the right with normal superficial femoral artery to the  above-knee popliteal position.  He had posterior tibial runoff.  The  left leg at that time had a widely patent superficial femoral and  popliteal artery with 2-vessel runoff with no evidence of  atherosclerotic disease.  He underwent at that time right superficial  femoral to tibioperoneal trunk bypass with saphenous vein.  The patient  had an early occlusion after several months, had attempted thrombolysis  and subsequently underwent thrombectomy and revision of the distal  anastomosis with a patch of the distal anastomosis.  The patient  suffered again early  reocclusion.  He underwent re-arteriogram in  February showing superficial femoral artery on the right and now being  quite small down to the above-knee position where it was occluded.  There was posterior tibial artery reconstitution at mid calf down into  the foot.  The patient had severe rest pain.  No tissue loss.  It was  recommended the patient undergo redo with vein from the left leg.  He  was unwilling to proceed at that time reporting he had legal issues as  well.  He subsequently was seen for a second opinion at [facility] with  Vascular Surgery in [place] and he had a repeat arteriogram at the  1st of April.  This showed no change in the right side, however, on the  left side, the patient had a popliteal artery occlusion.  On discussing  this with the patient, he reported having severe claudication and early  rest pain on the left leg which had been normal in the past over the  past 3 weeks.  I had a long discussion with the patient explaining  concern regarding progressive bilateral arterial insufficiency with  early failure of his right bypass.  He had  been on Coumadin  intermittently throughout this time, although he did not have the  specific diagnosis of hypercoagulability.  It was recommended the  patient to undergo right fem posterior tibial bypass with a vein from  the left leg for limb salvage and also exploration and hopeful  thrombectomy of his left popliteal artery at the same setting.  I had  discussed the concern regarding his recurrent failure and his risk for  eventual limb loss.  The patient understands and wishes to proceed with  bilateral revascularization.   PROCEDURE IN DETAIL:  The patient was taken to the operating room and  placed in the supine position, where the area of the both groins and  both legs were prepped and draped in the usual sterile fashion.  Incision was made over the left femoral pulse, carried down to isolate  the saphenofemoral  junction and using several skin bridges, the left  great saphenous vein was identified from the level of the groin to the  distal calf.  The vein was of good caliber.  That had been marked  preoperatively with ultrasound.  Next, the left popliteal artery was  exposed through the same vein harvest incision on the left and was  completely thrombosed.  The anterior tibial was small and atretic.  The  peroneal and posterior arteries were encircled with vessel loops.  Next,  the right posterior tibial artery was exposed through the mid calf, this  was very small, but did not have any significant atherosclerotic  disease.  Finally, a groin incision was made on the right to expose the  common, superficial femoral, and profunda femoris arteries.  These had  minimal atherosclerotic change.  A tunnel was created through the level  of the posterior tibial to the groin femoral artery.  The left great  saphenous vein was harvested from the distal calf to the groin with  tributary branches ligated with 3-0 and 4-0 silk ties and divided. The  vein was generally dilated and was found to be of big caliber.  The  patient was given 8000 units of intravenous heparin.  After adequate  circulation time, the posterior tibial and peroneal arteries were  occluded.  The popliteal artery was opened anteriorly and extended onto  the junction with a posterior tibial artery.  A #4 Fogarty catheter was  passed proximally and thrombus was removed from the popliteal artery  with excellent inflow and the arterialized plug was removed proximally.  This was reoccluded.  Next, a #3 Fogarty was passed down to the level of  the ankle from the posterior tibial and peroneal arteries with thrombus  removed and good back bleeding encountered.  A segment of the distal  saphenous vein was harvested from the right leg below the prior bypasses  and was sewn as a patch angioplasty over the popliteal artery.  Doppler  signal was noted in  the posterior tibial and peroneal arteries at the  completion of this.  Next, the vein which had been harvested was brought  onto the field.  The common, superficial femoral, and profunda femoris  arteries on the right were occluded.  The common femoral artery was  opened with a 11-blade and extended longitudinally with Potts scissors.  The vein was spatulated and sewn end-to-side to the artery with a  running 6-0 Prolene suture.  The anastomosis was tested and found to be  adequate.  The vein was then brought through the prior created tunnel  down to the level of  the posterior tibial artery.  Posterior tibial  artery was occluded proximally and distally and was opened with a 11-  blade and extended longitudinally with Potts scissors.  The patient did  have a small posterior tibial artery, the 2 dilator passed through this.  The vein was cut at the appropriate length, spatulated and sewn end-to-  side to the posterior tibial artery with a running 6-0 Prolene suture.  Prior to completion of the anastomosis, the usual flushing maneuvers  were undertaken, the anastomosis was completed.  Next, bilateral  intraoperative venograms were obtained on the right, this showed spasm  just distal to the anastomosis to the posterior tibial artery.  On the  left, there was a poor flow opacity ankle with collateral flow only.  For this reason, the left leg was re-approached.  The vein patch was  opened longitudinally and again a #3 Fogarty was passed down the  posterior tibial and the peroneal with no further thrombus and good back  bleeding.  Thought that no other options were available.  There were no  technical difficulties.  The patch was re-sewn with a 6-0 Prolene suture  and flow restored to the foot with good Doppler signal to the posterior  tibial and peroneal arteries.  Next, the right leg was addressed, it was  felt to be spasm in the posterior tibial artery.  To confirm this, the  graft was  occluded above the distal anastomosis and was opened  transversely, a 1-1/2 dilator passed through this area, spasm with  opening and a 2 dilator passed without resistance.  The incision in the  graft was closed with interrupted 6-0 Prolene sutures.  The patient had  been given several additional doses of heparin during the procedure and  this was not reversed with Protamine.  The wounds were irrigated with  saline.  Hemostasis with electrocautery.  Wounds were closed with 2-0 and 3-0  Vicryl in the subcutaneous and the skin was closed with 3-0 subcuticular  Vicryl stitch.  Sterile dressing was applied and the patient was taken  to the recovery room in stable condition.      Larina Earthly, M.D.  Electronically Signed     TFE/MEDQ  D:  01/25/2009  T:  01/26/2009  Job:  [id number]

## 2011-02-13 NOTE — Assessment & Plan Note (Signed)
OFFICE VISIT   [name], [name]  DOB:  12/25/1965                                       07/14/2008  CHART#:[id number]   The patient recently underwent thrombectomy and revision of right above  knee to below knee popliteal bypass with vein patch angioplasty of the  distal anastomosis and thrombectomy of his tibial vessels.  This was  performed on October 2.  He had some neuropathic pain postoperatively  and has been placed on Lyrica for this.  He presented to the office  today for postoperative visit also with complaints of swelling and some  occasional pain in his right pretibial area.  He states that the  neuropathic numbness and tingling in his right foot has improved  somewhat with the Lyrica.   PHYSICAL EXAM:  Both incisions are healing well without evidence of  breakdown.  His right foot is warm.  There is trace edema extending from  the knee down to the dorsum of the foot.   He had bilateral ABIs performed today which were 0.9 on the right side  with biphasic anterior tibial monophasic posterior tibial wave forms.  He also had a venous duplex performed which showed no evidence of DVT in  the right side.   I believe, overall, this probably represents some reperfusion edema and  also some typical postoperative edema from reoperative operation.  His  foot is pink, warm, well perfused.  Overall he is doing well.  He will  follow up in 3 months' time for repeat ABIs and office visit with Dr.  Arbie Cookey for further evaluation.   Janetta Hora. Fields, MD  Electronically Signed   CEF/MEDQ  D:  07/14/2008  T:  07/15/2008  Job:  [id number]

## 2011-02-13 NOTE — Op Note (Signed)
NAMEVOYD, GROFT NO.:  [id number]   MEDICAL RECORD NO.:  [id number]          PATIENT TYPE:  INP   LOCATION:  3309                         FACILITY:  [facility]   PHYSICIAN:  Larina Earthly, M.D.    DATE OF BIRTH:  25-Oct-1965   DATE OF PROCEDURE:  04/08/2008  DATE OF DISCHARGE:                               OPERATIVE REPORT   PREOPERATIVE DIAGNOSIS:  Severe ischemia, right foot.   POSTOPERATIVE DIAGNOSIS:  Severe ischemia, right foot.   PROCEDURE:  1. Right superficial femoral artery.  2. Tibioperoneal trunk bypass with translocated nonreversed saphenous      vein and thrombectomy of right popliteal artery.   SURGEON:  Larina Earthly, MD   ASSISTANT:  Wilmon Arms, PA-C   ANESTHESIA:  General endotracheal.   COMPLICATIONS:  None.   DISPOSITION:  Recovery room stable.   PROCEDURE IN DETAIL:  The patient was taken to the operating room,  placed in supine position and the area of the right groin and right leg  were prepped and draped in sterile fashion.  Ultrasound was used to  visualize the extent of the saphenous vein.  An incision was made over  the saphenous vein.  The vein was of good caliber.  Tributary branches  were ligated and divided with 3-0 and 4-0 silk ties and divided.  The  popliteal artery and tibioperoneal trunk were identified.  The posterior  tibial and peroneal arteries were isolated for control.  Next, a  separate incision was made above the knee to harvest the saphenous vein  up to proximal thigh and then the superficial femoral artery was exposed  through the same incision.  The artery had a good pulse at this level.  A tunnel was created between the level of the popliteal artery and the  superficial femoral artery.  The patient was given 9000 units of  intravenous heparin.  After adequate circulation time, the superficial  femoral artery was occluded proximally and distally was opened with an  11 blade and extended with 2 Potts  scissors.  The vein was brought onto  the field, it was spatulated and sewn end-to-side to the mid superficial  femoral artery with a running 6-0 Prolene suture.  Anastomosis was  tested and found to be adequate.  The vein was then marked to prevent  twisting and was brought through the prior created tunnel and the  popliteal space down to the level of the tibioperoneal trunk.  It should  be noted that there was a thrombectomy of the distal superficial femoral  artery and popliteal artery from the proximal side of the anastomosis  with good clot and good backbleeding.  This did restore flow to the  popliteal artery distally.  There did appear to be irregularity  throughout the popliteal and superficial femoral artery.  Next, the  tibioperoneal trunk was opened just above the level of the peroneal and  posterior tibial arteries.  This artery was opened and a #3 Fogarty  catheter was passed down the posterior tibial artery with no clot  removed from the posterior tibial artery.  A #2 dilator passed without  resistance through this.  Next, the #3 Fogarty catheter was passed down  the peroneal artery and thrombus was removed from the peroneal artery  with good backbleeding.  The graft was cut to appropriate length and was  divided, spatulated, and sewn end-to-side to the tibioperoneal trunk  with a running 6-0 Prolene suture.  Clamps removed and good Doppler  signal was noted in the foot.  The patient was given 50 mg of protamine  reverse heparin.  The wounds were irrigated with saline, hemostasis with  electrocautery.  The wounds were closed with 2-0 Vicryl to close the  fascia in the popliteal and the thigh incision and the skin was closed  with 3-0 subcuticular Vicryl stitch.  Sterile dressing was applied.  The  patient was taken to the recovery room in stable condition.      Larina Earthly, M.D.  Electronically Signed     TFE/MEDQ  D:  04/08/2008  T:  04/08/2008  Job:  [id number]

## 2011-02-13 NOTE — Discharge Summary (Signed)
[name], [name] NO.:  [id number]   MEDICAL RECORD NO.:  [id number]          PATIENT TYPE:  INP   LOCATION:  2002                         FACILITY:  [facility]   PHYSICIAN:  Janetta Hora. Fields, MD  DATE OF BIRTH:  03/04/1966   DATE OF ADMISSION:  06/24/2008  DATE OF DISCHARGE:  07/09/2008                               DISCHARGE SUMMARY   FINAL DISCHARGE DIAGNOSIS:  Peripheral vascular disease resulting in  right lower leg ischemia.   PROCEDURES PERFORMED:  1. Right lower extremity lysis on June 24, 2008, by Dr. Manson Passey in      Radiology.  2. Revision of right above-knee bypass with thrombectomy.  3. Vein patch angioplasty of distal anastomosis.  4. Thrombectomy of tibial vessels.  5. Popliteal endarterectomy.  6. Intraoperative arteriogram.  All done by Dr. Darrick Penna on July 02, 2008.   COMPLICATIONS:  None.   DISCHARGE MEDICATIONS:  He was instructed to resume all preoperative  medications consisting of lisinopril p.o. daily, hydrochlorothiazide  p.o. daily, Xanax 1 mg p.o. q.i.d., Protonix 40 mg p.o. daily, and  aspirin 81 mg p.o. daily.  He is given prescriptions for Lyrica 75 mg  p.o. t.i.d., Percocet 5/325 one p.o. q.4 h. p.r.n. pain, total #40 was  given, Coumadin 5 mg tablets he is to take as directed, he is to take  7.5 mg p.o. nightly until July 12, 2008, when he has to see Dr.  Donnie Aho for adjustment of the Coumadin.   CONDITION AT DISCHARGE:  Stable, improving   DISPOSITION:  He is being discharged home in stable condition.  He is  instructed as to his diet and activity level.  He is to see Dr. Darrick Penna  in 3 weeks with ABIs.  He does not need to keep the appointment with Dr.  Arbie Cookey for July 16, 2008.  The office will arrange the visit.   BRIEF IDENTIFYING STATEMENT:  For complete details, please refer the  typed history and physical.  Briefly, this very pleasant 45 year old  gentleman is followed for a peripheral vascular disease.   He had a  bypass done with Propaten graft earlier.  On  June 24, 2008, he  developed acute pain in his right lower extremity.  He presented to the  hospital and was admitted.   HOSPITAL COURSE:  He was found to have the bypass graft on his right leg  that was down.  He was admitted and underwent lysis.  Over the next  several days, he did have some improved blood flow.  He did remain at a  high risk for limb loss.  Despite lysis, he did have persistent disease  in the bypass graft and peroneal arteries.  The idea of potential  revascularization was advanced to the patient.  He eventually desired  surgery.  He was taken to the operating room and underwent the  aforementioned revascularization procedure on July 02, 2008, by Dr.  Darrick Penna.  For complete details, please refer the typed operative report.  The procedure was without complications.  Postoperatively, he did  display improvement.  He  was walking and improving physical therapy.  He  was started on the Lyrica for neurogenic pain.  He did have some  improvement with this.  Postoperatively, he was walking and improving  with physical therapy.  He was anticoagulated with heparin.  He should  be on this for the rest of his life unless his graft goes down and  requires amputation.  At that time, his Coumadin will be readdressed.   On July 09, 2008, he was walking and improving with physical therapy.  He was desirous of discharge and was discharged home in stable  condition.      Wilmon Arms, PA      Janetta Hora. Fields, MD  Electronically Signed    KEL/MEDQ  D:  07/09/2008  T:  07/09/2008  Job:  [id number]   cc:   Janetta Hora. Darrick Penna, MD

## 2011-02-13 NOTE — Op Note (Signed)
[name], [name] NO.:  [id number]   MEDICAL RECORD NO.:  [id number]          PATIENT TYPE:  INP   LOCATION:  3303                         FACILITY:  [facility]   PHYSICIAN:  Janetta Hora. Fields, MD  DATE OF BIRTH:  1966/09/23   DATE OF PROCEDURE:  07/02/2008  DATE OF DISCHARGE:                               OPERATIVE REPORT   PROCEDURE:  1. Thrombectomy and revision of right above-knee to below-knee      popliteal bypass.  2. Vein patch angioplasty of distal anastomosis.  3. Thrombectomy of tibial vessels.  4. Popliteal endarterectomy.  5. Intraoperative arteriogram.   PREOPERATIVE DIAGNOSIS:  Ischemia right foot.   POSTOPERATIVE DIAGNOSIS:  Ischemia right foot.   ANESTHESIA:  General.   ASSISTANTS:  Jess Barters, RNFA, Wilmon Arms, PA-C   FINDINGS:  1. 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  2. Runoff via peroneal and posterior tibial artery.   INDICATIONS:  The patient is a 45 year old male who previously underwent  a right above-knee to below-knee popliteal bypass by Dr. Arbie Cookey several  months ago.  He presented 1 week ago with occlusion of his bypass graft.  He underwent lysis of his bypass graft for approximately 3 days and had  no significant distal outflow.  He presents for revascularization of his  right lower extremity for limb salvage.   OPERATIVE DETAILS:  After obtaining informed consent, the patient was  taken to the operating room.  The patient placed in supine position on  the operating table.  After induction of general anesthesia and  endotracheal intubation, both lower extremities were prepped and draped  in usual sterile fashion.  Next, a longitudinal incision was made on the  medial aspect of the right leg through a preexisting below-knee scar.  Incision was carried down through the subcutaneous tissues down the  level of the popliteal fossa.  Dissection was carried down to the level  of the vein graft.  This  was occluded.  This was dissected free  circumferentially.  Dissection was then carried down on the level of the  previous distal anastomosis which appeared to be the distal popliteal  artery of the tibioperoneal trunk.  The tibioperoneal trunk and large  side branches were all dissected free.  This was fairly tedious as there  were dense adhesions to adjacent veins and this required several suture  repairs of perforations in the vein.  The tibial vessels were quite  small.  All of these were 1.5 mm or less in diameter.  These were all  dissected free circumferentially and vessel loops were placed around  these.  There were three main vessels which were dissected free and  these were all fairly small in character.  There was one proceeding  deeper into the leg which appeared to be the posterior tibial artery.  The native popliteal artery was dissected free circumferentially and  controlled with a vessel loop.  The patient was then given 5000 units of  intravenous heparin.  The patient was given an additional 5000 units of  heparin during the course of the case.  A longitudinal opening was then  made in the distal aspect of the vein graft and extended down onto the  tibioperoneal trunk.  Upon opening the vein graft, there was chronic  adherent thrombus within this.  The thrombus had been present so long  that it had become organized and adherent to the wall.  This actually  required endarterectomy of the native vessel to remove the thrombus.  A  large plug was removed with portions of this plug extending into the  origins of all the tibial vessels.  This was all removed under direct  vision.  A #2 Fogarty catheter was then passed down all three of the  larger side branches.  The #2 Fogarty would only pass down one of these  branches for the full 70 cm.  The other branch of the catheter would  only pass for the first 15-20 cm.  Next, these were reoccluded with  vessel loops.  The vein graft  was then thrombectomized with #3 and #4  Fogarty catheters and all thrombotic material was removed and there was  excellent arterial inflow at this point.  A piece of greater saphenous  vein was then harvested from the right groin area.  The main portion of  the saphenous vein was left intact as well as saphenofemoral junction  and a large side branch was harvested for approximately 3 cm.  This was  ligated proximally and distally with a 2-0 silk tie and then each end  transected.  This was then gently flushed with heparinized saline and  distended.  The vein was then placed in a reversed configuration and  opened longitudinally.  Interrupted 7-0 Prolene sutures were then used  to secure the toe of the anastomosis.  Through these were placed.  The 7-  0 Prolene was then used as a running stitch on either side to complete a  patch angioplasty which extended down onto the tibial vessels and up  onto the hood of the vein graft.  Just prior to completion of  anastomosis, everything was fore-bled, back-bled and thoroughly flushed.  Anastomosis was secured, clamps were released and there was pulsatile  flow into the vein graft immediately.  Doppler was used to evaluate the  small side branches and each of these had Doppler flow.  There was a  biphasic posterior tibial Doppler signal at the level of the ankle.  Next, intraoperative arteriogram was performed with inflow occlusion.  A  21-gauge butterfly needle was then introduced into the distal portion of  the vein graft and with inflow occlusion, an arteriogram was obtained  after administering 20 mg of papaverine down the tibial vessels.  This  showed a patent distal anastomosis with runoff via the peroneal and  posterior tibial artery.  The peroneal artery was patent all the way to  the level of the ankle.  The posterior tibial artery was patent all the  way into the foot.  The patient was then placed on a heparin drip.  The  saphenectomy  incision was closed with a running 3-0 Vicryl suture and a  4-0 Vicryl suture and a subcuticular stitch in the skin.  Hemostasis was  obtained in the below-knee incision.  This was then closed with multiple  layers of running 2-0 Vicryl suture.  The subcutaneous layer was then  closed with a running 3-0 Vicryl suture.  The skin closed with 4-0  Vicryl subcuticular stitch.  The patient tolerated the procedure well  and there were no complications.  Instrument, sponge and needle counts  were correct at the end of the case.  The patient taken to the recovery  room in stable condition.      Janetta Hora. Fields, MD  Electronically Signed     CEF/MEDQ  D:  07/02/2008  T:  07/03/2008  Job:  [id number]

## 2011-02-13 NOTE — Discharge Summary (Signed)
[name], OTTAWAY NO.:  [id number]   MEDICAL RECORD NO.:  [id number]          PATIENT TYPE:  INP   LOCATION:  2041                         FACILITY:  [facility]   PHYSICIAN:  Larina Earthly, M.D.    DATE OF BIRTH:  March 13, 1966   DATE OF ADMISSION:  04/08/2008  DATE OF DISCHARGE:  04/13/2008                               DISCHARGE SUMMARY   DISCHARGE DIAGNOSES:  1. Peripheral vascular disease, right lower extremity ischemia.  2. Hiatal hernia with gastroesophageal reflux disease.  3. PTFE.  4. Panic attacks.  5. History of tobacco abuse   PROCEDURE PERFORMED:  On April 08, 2008, right superficial artery to  tibial peroneal trunk bypass with translocated nonreversed saphenous  vein and thrombectomy of popliteal artery by Dr. Arbie Cookey.   COMPLICATIONS:  None.   DISCHARGE MEDICATIONS:  Percocet 5/325 one p.o. q.4 h. p.r.n. pain and  Compazine 5 mg p.o. q.6 h. p.r.n. nausea.  He was also instructed to see  his personal physician for prescriptions due to the fact that he has not  taken any prescriptions in 2 months and which has been due to  incarceration.   DISPOSITION:  He is being discharged home in stable condition with his  wounds healing well.  He is instructed to clean his wounds with soap and  warm water.  He is to observe and call for drainage, increasing redness,  swelling, pain, and fever greater than 101.2.  He is to see Dr. Arbie Cookey in  2-3 weeks with ABIs.  The office will call and arrange a visit.  He is  also to see his primary care physician about his prescriptions.   CONDITION ON DISCHARGE:  Stable and improving.   BRIEF IDENTIFYING STATEMENT:  For complete details, please refer the  typed history and physical.  Briefly, this 45-year-old gentleman  presented to Dr. Arbie Cookey with severe ischemia of his right foot.  He has  several other issues which are delineated in his history and physical.  Dr. Arbie Cookey evaluated and felt that he should undergo  revascularization.  He was informed of the risks and benefits of the procedure, and after  careful consideration he elected to proceed with surgery.   HOSPITAL COURSE:  Preoperative workup was completed as an outpatient.  He was brought in through the same day surgery and underwent the  aforementioned revascularization procedure.  For complete details,  please refer the typed operative report.  The procedure was without  complications.  He was returned to the Postanesthesia Care Unit  and extubated.  He was transferred to a bed in a surgical step-down  unit.  He was observed overnight and transferred to a bed on a surgical  convalescent floor.  Over the next several days, he was mobilized.  His  diet was advanced.  He was felt stable and desires discharge.  He was  discharged home in stable and improving condition on April 13, 2008.      Wilmon Arms, PA       ______________________________  Larina Earthly, M.D.    KEL/MEDQ  D:  04/13/2008  T:  04/14/2008  Job:  [id number]   cc:   Larina Earthly, M.D.

## 2011-02-16 NOTE — Consult Note (Signed)
NAME:  DIO, GILLER NO.:  [id number]   MEDICAL RECORD NO.:  [id number]                   PATIENT TYPE:  EMS   LOCATION:  MAJO                                 FACILITY:  [facility]   PHYSICIAN:  Adolph Pollack, M.D.            DATE OF BIRTH:  [birth year]   DATE OF CONSULTATION:  02/25/2004  DATE OF DISCHARGE:                                   CONSULTATION   REFERRING PHYSICIAN:  Dr. Celene Kras.   REASON FOR CONSULTATION/VISIT:  Epigastric pain.   HISTORY OF PRESENT ILLNESS:  Mr. Haberer is a 45 year old male who has been  treated for gastroesophageal reflux disease and a hiatal hernia with proton  pump inhibitors (Protonix).  He has also had endoscopy by Dr. Judie Petit T.  Russella Dar that shows some erosive esophagitis and hiatal hernia.  He had the  onset of progressively increasing pressure-type epigastric pain radiating  through to the back and associated with nausea and vomiting.  It was  unrelenting and he came to the emergency department early this morning for  evaluation.  Since he has been here, he has been evaluated.  CBC, liver  function tests, amylase and lipase were all normal.  He had been given  multiple rounds of narcotics with some relief, but he still has some pain.  An ultrasound demonstrated multiple gallstones and extensive gallbladder  wall thickening.  It was for that reason I was asked to see him.  He denies  jaundice.  He has had some chills but no fever.   PAST MEDICAL HISTORY:  1. Hiatal hernia with gastroesophageal reflux disease.  2. Post-traumatic stress disorder.  3. Panic attacks.   PREVIOUS OPERATIONS:  None.   ALLERGIES:  None.   MEDICATIONS:  Xanax, Protonix, Lexapro, Wellbutrin.   SOCIAL HISTORY:  He has children and states he is married.  Occasional  alcohol use.  Positive tobacco use.   REVIEW OF SYSTEMS:  CARDIOVASCULAR:  No known hypertension or heart disease.  PULMONARY:  Denies asthma, pneumonia,  bronchitis.  GI:  Denies peptic ulcer  disease, hepatitis or colitis.  GU:  Denies kidney stones or urinary tract  infection.  NEUROLOGIC:  Denies seizure disorder.  HEMATOLOGIC:  Denies  bleeding disorders or blood clots.   PHYSICAL EXAM:  GENERAL:  Generally, a well-developed, well-nourished male  in no acute distress, pleasant and cooperative.  VITAL SIGNS:  He is afebrile with normal heart rate and blood pressure.  EYES:  Extraocular motions intact.  No icterus.  NECK:  Neck supple without palpable masses or obvious thyroid enlargement.  RESPIRATORY:  Breath sounds equal and clear.  Respirations unlabored.  CARDIOVASCULAR:  Cardiovascular demonstrated regular rate and rhythm, no  murmur heard.  No JVD, no lower extremity edema.  ABDOMEN:  Abdomen is soft.  There is some right upper quadrant tenderness to  palpation but no Murphy's sign.  No palpable mass is present.  Active  bowel  sounds heard.  No hernias.  EXTREMITIES:  Good muscle tone, full range of motion.  NEUROLOGIC:  He is alert and oriented.  He does answer questions  appropriately and has a normal gait and station.   IMPRESSION:  Acute biliary colic secondary to cholelithiasis, possible early  cholecystitis.  White cell count is normal and he is afebrile at this time,  still having some pain, despite pain medication.   PLAN:  I suggested to him that he be admitted and undergo a laparoscopic  cholecystectomy, as he may be evolving into acute cholecystitis.  I went  over the procedure and the risks with him.  The risks include, but not  limited to, bleeding, infection, common bile duct injury, hepatic injury,  small intestinal injury, postprandial diarrhea, and risks of general  anesthesia.  After discussing this with him, he says he has some family  issues and he has some children he needs to attend with.  He says he will  not be able to stay and have the cholecystectomy today.  I told him if that  were the case, then I  could try to work with him by putting him on Cipro and  oral analgesic.  He can try this for a day or two but if he is still not  improving, then I told him to call us back in 2 days.  The diet will be  liquids for 2 days and bland food, as symptoms seem to be related to eating  spicy and fatty foods.  If he does improve, we will arrange for elective  cholecystectomy for him.                                               Adolph Pollack, M.D.    Kari Baars  D:  02/25/2004  T:  02/25/2004  Job:  [id number]   cc:   Venita Lick. Russella Dar, M.D. [facility]

## 2011-02-16 NOTE — H&P (Signed)
NAME:  [name], [name]                         ACCOUNT NO.:  [id number]   MEDICAL RECORD NO.:  [id number]                   PATIENT TYPE:  EMS   LOCATION:  ED                                   FACILITY:  [facility]   PHYSICIAN:  Currie Paris, M.D.           DATE OF BIRTH:  Jun 22, 1966   DATE OF ADMISSION:  03/04/2004  DATE OF DISCHARGE:                                HISTORY & PHYSICAL   CHIEF COMPLAINT:  Abdominal pain.   HISTORY OF PRESENT ILLNESS:  Mr. Dafoe underwent a laparoscopic  cholecystectomy yesterday by Dr. Abbey Chatters for what appears to have been a  subacute cholecystitis.  His cholangiogram was normal.  He went home this  morning but has continued to have pain, basically episode, some around the  umbilicus and in the right upper quadrant.  It has been completely  unrelieved by two Tylox every four hours.  He has not had any nausea or  vomiting but just has not felt well.  He has not had any fevers or chills.  Because of his symptoms, he came back to the emergency room for re-  evaluation.   PAST MEDICAL HISTORY:  Unchanged from the recent admission yesterday.  Not  redictated into this admission note.   FAMILY HISTORY:  Unchanged from the recent admission yesterday.  Not  redictated into this admission note.   REVIEW OF SYSTEMS:  Unchanged from the recent admission yesterday, not  redictated into this admission note.   PHYSICAL EXAMINATION:  VITAL SIGNS:  Blood pressure 115/72.  His pulse was  96 when the nurses took it, approximately 80 when I took it.  Respirations  20.  Temp 98.2.  GENERAL:  The patient appears somewhat uncomfortable but alert and oriented.  HEENT:  Head is normocephalic.  He is nonicteric.  Mucous membranes seem  moist.  NECK:  Supple.  LUNGS:  Clear to auscultation.  HEART:  Regular.  ABDOMEN:  Basically soft except he has tenderness and guarding in the right  upper quadrant, perhaps a little bit more than standard for after a  laparoscopic cholecystectomy but certainly no rigidity.  No peritoneal  signs.  Bowel sounds are present.  Incisions are all okay.   LABORATORY DATA:  So far, CBC is normal.  CMET, amylase, and lipase are  pending.   IMPRESSION:  Somewhat atypical postoperative abdominal pain.   PLAN:  I do not think he has any evidence of bowel perforation based on his  overall abdominal exam, but I am concerned he might have bowel leak.  I am  going to go ahead and obtain a hepatobiliary scan.  If that is negative, we  will release him on some stronger pain medication, Dilaudid 2-4 mg every 4-6  hours as needed for pain for a couple of days and follow up next week in the  office.  This will be pending his remaining laboratory studies and  hepatobiliary scan.  Currie Paris, M.D.    CJS/MEDQ  D:  03/04/2004  T:  03/04/2004  Job:  [id number]

## 2011-02-16 NOTE — Op Note (Signed)
NAME:  [name], [name]                         ACCOUNT NO.:  [id number]   MEDICAL RECORD NO.:  [id number]                   PATIENT TYPE:  OIB   LOCATION:  5733                                 FACILITY:  [facility]   PHYSICIAN:  Adolph Pollack, M.D.            DATE OF BIRTH:  08-24-1966   DATE OF PROCEDURE:  03/03/2004  DATE OF DISCHARGE:                                 OPERATIVE REPORT   PREOPERATIVE DIAGNOSIS:  Symptomatic cholelithiasis.   POSTOPERATIVE DIAGNOSIS:  Subacute cholelithiasis.   PROCEDURE:  Laparoscopic cholecystectomy with interoperative cholangiogram.   SURGEON:  Adolph Pollack, M.D.   ASSISTANT:  Ollen Gross. Carolynne Edouard, M.D.   ANESTHESIA:  General.   INDICATIONS FOR PROCEDURE:  This is a 45 year old male I saw last week in  the emergency room  with biliary colic.  He was having enough pain that I  suggested he be admitted and have a cholecystectomy done a week ago but he  told me he had some social issues he had to take care of.  I gave him some  oral analgesic and Cipro and now he presents for his surgery.  He is still  having some of the pains.   SURGICAL TECHNIQUE:  He was seen in the holding area and brought to the  operating room and placed supine on the operating table and a general  anesthetic was administered.  The hair was clipped from his abdominal wall  and the abdominal wall was sterilely prepped and draped.  Dilute Marcaine  solution was infiltrated in the subumbilical region.  An incision was made  through the skin, subcutaneous tissue, and then midline fascia.  The  peritoneal cavity was entered bluntly and under direct vision.  A purse-  string suture of 0 Vicryl was placed around the fascial edges.  A Hasson  trocar was introduced into the peritoneal cavity and a pneumoperitoneum was  created by insufflation of CO2 gas.   Next, he was placed in reversed Trendelenburg position with the right side  tilted slightly up.  Under direct vision, an 11 mm  trocar was placed through  an epigastric incision and two 5 mm trocars were placed through the right  mid lateral abdomen.  The fundus of the gallbladder was grasped and noted to  be partially intrahepatic.  There were omental adhesions to the gallbladder  and it appeared mildly inflamed. The omental adhesions were taken down and  the fundus was retracted toward the right shoulder.  The infundibulum was  grasped and mobilized using blunt dissection.  The cystic duct was isolated  and a window created around it.  A clip was placed at the cystic duct  gallbladder junction and a small incision made in the cystic duct.  A  cholangiogram catheter was passed through the anterior abdominal wall into  the cystic duct.  I had trouble threading it and because of this, I also  made an  incision a little bit lower in the cystic duct after skeletonizing  it further and then the cholangiogram catheter easily fit through the cystic  duct.  A cholangiogram was then performed.  Under real time fluoroscopy,  contrast material was injected into the cystic duct.  The common hepatic,  right and left hepatic, and common bile ducts were all opacified rapidly and  the contrast drained rapidly into the duodenum without obvious evidence of  obstruction.  The final report is pending the radiologist interpretation.  I  then removed the cholangiogram catheter, the cystic duct was clipped three  times staying inside and divided.  The anterior and posterior branch of the  cystic artery were identified, clipped and divided.  The gallbladder was  then dissected free from the liver bed using cautery.  A small puncture  wound was made in the gallbladder with minimal leakage of bile.  Once the  gallbladder was removed, it was placed in an endopouch bag.  I then  copiously irrigated the gallbladder fossa.  Bleeding points were controlled  with cautery.  A piece of Surgicel was placed in the gallbladder fossa.  The  irrigation  fluid was evacuated by way of suction.  The gallbladder was  removed from the subumbilical port and the endopouch bag.  The subumbilical  fascial defect was closed under laparoscopic vision by tightening up and  tying down the purse-string suture.  The remaining trocars were removed and  a pneumoperitoneum was released.  The skin incisions were closed with 4-0  Monocryl subcuticular stitches.  Steri-Strips and sterile dressings were  applied.  He tolerated the procedure well without any apparent complication  and was taken to the recovery room in satisfactory good condition.                                               Adolph Pollack, M.D.    Kari Baars  D:  03/03/2004  T:  03/03/2004  Job:  [id number]

## 2011-06-28 LAB — CBC
HCT: 35 — ABNORMAL LOW
HCT: 38.8 — ABNORMAL LOW
Hemoglobin: 12.1 — ABNORMAL LOW
Hemoglobin: 13.2
MCHC: 34.1
MCHC: 34.5
MCV: 84.2
RBC: 4.18 — ABNORMAL LOW
RBC: 4.61
RDW: 14

## 2011-06-28 LAB — COMPREHENSIVE METABOLIC PANEL
BUN: 6
CO2: 27
Calcium: 8.1 — ABNORMAL LOW
Creatinine, Ser: 0.77
GFR calc Af Amer: >60
GFR calc non Af Amer: >60
Glucose, Bld: 85
Total Bilirubin: 0.7

## 2011-06-28 LAB — PROTIME-INR
INR: 1
Prothrombin Time: 13.5

## 2011-06-28 LAB — APTT: aPTT: 49 — ABNORMAL HIGH

## 2011-06-28 LAB — BASIC METABOLIC PANEL
CO2: 28
Calcium: 7.9 — ABNORMAL LOW
GFR calc Af Amer: >60
GFR calc non Af Amer: >60
Glucose, Bld: 100 — ABNORMAL HIGH
Potassium: 3.6
Sodium: 139

## 2011-06-28 LAB — POCT I-STAT, CHEM 8
BUN: 8
Chloride: 103
Creatinine, Ser: 1
Sodium: 140
TCO2: 28

## 2011-06-28 LAB — CROSSMATCH

## 2011-06-30 ENCOUNTER — Emergency Department (HOSPITAL_COMMUNITY): Payer: Medicare Other

## 2011-06-30 ENCOUNTER — Inpatient Hospital Stay (HOSPITAL_COMMUNITY)
Admission: EM | Admit: 2011-06-30 | Discharge: 2011-07-02 | DRG: 440 | Disposition: A | Discharge status: Home / Self Care | Payer: Medicare Other | Attending: Internal Medicine | Admitting: Internal Medicine

## 2011-06-30 DIAGNOSIS — R791 Abnormal coagulation profile: Secondary | ICD-10-CM | POA: Diagnosis present

## 2011-06-30 DIAGNOSIS — I739 Peripheral vascular disease, unspecified: Secondary | ICD-10-CM | POA: Diagnosis present

## 2011-06-30 DIAGNOSIS — F17200 Nicotine dependence, unspecified, uncomplicated: Secondary | ICD-10-CM | POA: Diagnosis present

## 2011-06-30 DIAGNOSIS — K859 Acute pancreatitis without necrosis or infection, unspecified: Principal | ICD-10-CM | POA: Diagnosis present

## 2011-06-30 DIAGNOSIS — F1010 Alcohol abuse, uncomplicated: Secondary | ICD-10-CM | POA: Diagnosis present

## 2011-06-30 DIAGNOSIS — M79609 Pain in unspecified limb: Secondary | ICD-10-CM | POA: Diagnosis present

## 2011-06-30 LAB — CBC
HCT: 42.5 % (ref 39.0–52.0)
MCH: 28 pg (ref 26.0–34.0)
MCV: 81 fL (ref 78.0–100.0)
RBC: 5.25 MIL/uL (ref 4.22–5.81)
WBC: 11.2 10*3/uL — ABNORMAL HIGH (ref 4.0–10.5)

## 2011-06-30 LAB — COMPREHENSIVE METABOLIC PANEL
ALT: 50 U/L (ref 0–53)
Alkaline Phosphatase: 85 U/L (ref 39–117)
BUN: 8 mg/dL (ref 6–23)
CO2: 25 mEq/L (ref 19–32)
Chloride: 107 mEq/L (ref 96–112)
GFR calc Af Amer: >60 mL/min (ref >60)
GFR calc non Af Amer: >60 mL/min (ref >60)
Glucose, Bld: 110 mg/dL — ABNORMAL HIGH (ref 70–99)
Potassium: 3.6 mEq/L (ref 3.5–5.1)
Sodium: 142 mEq/L (ref 135–145)
Total Bilirubin: 0.2 mg/dL — ABNORMAL LOW (ref 0.3–1.2)
Total Protein: 7.2 g/dL (ref 6.0–8.3)

## 2011-06-30 LAB — DIFFERENTIAL
Lymphocytes Relative: 13 % (ref 12–46)
Lymphs Abs: 1.5 10*3/uL (ref 0.7–4.0)
Monocytes Relative: 5 % (ref 3–12)
Neutrophils Relative %: 82 % — ABNORMAL HIGH (ref 43–77)

## 2011-07-01 LAB — LIPASE, BLOOD: Lipase: 292 U/L — ABNORMAL HIGH (ref 11–59)

## 2011-07-01 LAB — BASIC METABOLIC PANEL
Calcium: 8.1 mg/dL — ABNORMAL LOW (ref 8.4–10.5)
Chloride: 107 mEq/L (ref 96–112)
Creatinine, Ser: 0.7 mg/dL (ref 0.50–1.35)
GFR calc Af Amer: >60 mL/min (ref >60)
Sodium: 142 mEq/L (ref 135–145)

## 2011-07-01 LAB — HEPATIC FUNCTION PANEL
ALT: 284 U/L — ABNORMAL HIGH (ref 0–53)
Bilirubin, Direct: 0.2 mg/dL (ref 0.0–0.3)
Indirect Bilirubin: 0.3 mg/dL (ref 0.3–0.9)

## 2011-07-01 LAB — CBC
MCH: 27.3 pg (ref 26.0–34.0)
MCHC: 33.4 g/dL (ref 30.0–36.0)
MCV: 81.6 fL (ref 78.0–100.0)
Platelets: 221 10*3/uL (ref 150–400)
RBC: 4.88 MIL/uL (ref 4.22–5.81)

## 2011-07-01 NOTE — H&P (Signed)
[name], [name] NO.:  [id number]  MEDICAL RECORD NO.:  [id number]  LOCATION:  5006                         FACILITY:  [facility]  PHYSICIAN:  Della Goo, M.D. DATE OF BIRTH:  1966-09-25  DATE OF ADMISSION:  06/30/2011 DATE OF DISCHARGE:                             HISTORY & PHYSICAL   PRIMARY CARE PHYSICIAN:  Unassigned.  CHIEF COMPLAINT:  Chest and abdominal pain.  HISTORY OF PRESENT ILLNESS:  This is a 45 year old male who presents to the emergency department with complaints of epigastric area and substernal area abdominal pain, which occurred 30 or 45 minutes after drinking one-half a pint of alcohol.  The patient has not had a previous alcohol history according to him and he [place] the past 6 months and just got out 1 day ago.  The patient states the pain is a 06/10 at the worst and radiates into the back.  He denies having any nausea, vomiting, or diarrhea.  He denies having any fevers or chills.  Denies having any shortness of breath.  The patient was evaluated in the emergency department.  Laboratory studies were performed and he was found to have a lipase level of 913. The patient was referred for medical admission for acute pancreatitis.  PAST MEDICAL HISTORY:  Significant for: 1. Peripheral vascular disease. 2. Bilateral lower extremity DVTs. 3. 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 4. Hypertension. 5. Anxiety. 6. The patient also has a history of peripheral neuropathy.  MEDICATIONS AT THIS TIME:  Coumadin and Prilosec.  ALLERGIES:  ZOLOFT and PAXIL.  PAST SURGICAL HISTORY:  Also includes a cholecystectomy.  SOCIAL HISTORY:  The patient is a smoker, smokes a half-pack cigarettes for the past 27 years and he denies any regular alcohol usage.  He has a former history of illicit drug usage.  FAMILY HISTORY:  Positive for coronary artery disease in his father. Hypertension in his father.   Cancer in his maternal family.  Melanoma in his maternal grandfather and lung cancer in his maternal grandmother who was a smoker.  REVIEW OF SYSTEMS:  Pertinent as mentioned above.  PHYSICAL EXAMINATION FINDINGS:  GENERAL:  This is a 45 year old well- nourished, well-developed Caucasian male who is in discomfort but no acute distress. VITAL SIGNS:  Temperature 98.3, blood pressure 108/52, heart rate 109, respirations 15, O2 sats 100%. HEENT:  Normocephalic, atraumatic.  Pupils equally round, reactive to light.  Extraocular movements are intact.  Funduscopic benign.  Nares are patent bilaterally.  Oropharynx is clear. NECK:  Supple.  Full range of motion.  No thyromegaly, adenopathy or jugular venous distention. CARDIOVASCULAR:  Tachycardiac rate and rhythm.  No murmurs, gallops, or rubs appreciated. LUNGS:  Clear to auscultation bilaterally.  No rales, rhonchi or wheezes. ABDOMEN:  Positive bowel sounds, soft and tender in the epigastrium. There is no abdominal distention.  There is no hepatosplenomegaly.  No rebound, no guarding. EXTREMITIES:  Without cyanosis, clubbing, or edema.  The patient has scarring on the medial aspects of both lower legs consistent with his bypass grafting surgeries. NEUROLOGIC:  Otherwise grossly nonfocal.  LABORATORY STUDIES:  White blood cell count 11.2, hemoglobin 14.7,  hematocrit 42.5, MCV 81.0, platelets 250, neutrophils 82%, lymphocytes 13%.  Sodium 142, potassium 3.6, chloride 107, carbon dioxide 25, BUN 8, creatinine 0.82, and glucose 110.  Lipase 913.  Alcohol level 99.  Chest x-ray reveals no evidence of acute cardiopulmonary disease.  Protime as already mentioned is 19.0, INR 1.56.  ASSESSMENT:  A 45 year old male being admitted with 1. Acute pancreatitis. 2. Epigastric abdominal pain secondary to acute pancreatitis. 3. Peripheral vascular disease. 4. Tobacco abuse. 5. Alcohol intoxication. 6. Subtherapeutic Coumadin level, history of  deep venous thromboses. 7. Peripheral vascular disease  PLAN:  The patient will be admitted and placed on bowel rest secondary to the acute pancreatitis.  IV fluids have been ordered for maintenance therapy and pain control and antiemetic therapy have also been ordered. The patient will be placed on IV Protonix therapy at this time.  His electrolytes and lipase levels will be monitored.  The patient will be placed on the IV Ativan, CIWA protocol for alcohol withdrawal symptoms. It is felt that the patient should not withdraw from alcohol since he has only had 1 day of drinking.  The patient is currently subtherapeutic with his Coumadin and this will be further adjusted per pharmacy, and his PT and INRs will be monitored. Further workup will ensue pending results of the patient's clinical course.     Della Goo, M.D.     HJ/MEDQ  D:  07/01/2011  T:  07/01/2011  Job:  [id number]  Electronically Signed by Della Goo M.D. on 07/01/2011 10:29:44 PM

## 2011-07-02 LAB — CBC
HCT: 37.7 — ABNORMAL LOW
HCT: 39
HCT: 45.6
HCT: 30.9 — ABNORMAL LOW
HCT: 32.4 — ABNORMAL LOW
HCT: 35.3 — ABNORMAL LOW
HCT: 37 — ABNORMAL LOW
HCT: 38.4 — ABNORMAL LOW
Hemoglobin: 12.1 — ABNORMAL LOW
Hemoglobin: 13.2
Hemoglobin: 15.1
Hemoglobin: 10.4 — ABNORMAL LOW
Hemoglobin: 11.1 — ABNORMAL LOW
Hemoglobin: 12 — ABNORMAL LOW
Hemoglobin: 12.9 — ABNORMAL LOW
MCHC: 33.7
MCHC: 33.9
MCHC: 34.5
MCHC: 34
MCHC: 34.2
MCV: 83.7
MCV: 84.3
MCV: 85.5
MCV: 86
MCV: 86.2
MCV: 84.8
MCV: 85.8
MCV: 86.3
Platelets: 200
Platelets: 216
Platelets: 238
Platelets: 312
Platelets: 333
RBC: 4.15 — ABNORMAL LOW
RBC: 4.23
RBC: 4.47
RBC: 4.56
RBC: 4.91
RBC: 3.58 — ABNORMAL LOW
RBC: 4.47
RDW: 13.8
RDW: 13.8
RDW: 13.9
RDW: 14
WBC: 10.5
WBC: 6
WBC: 6.1
WBC: 7.2
WBC: 7.4
WBC: 8.2
WBC: 7.8
WBC: 8.1
WBC: 9.8

## 2011-07-02 LAB — PROTIME-INR
INR: 1
INR: 1.1
INR: 1.8 — ABNORMAL HIGH
INR: 2.1 — ABNORMAL HIGH
INR: 2.2 — ABNORMAL HIGH
Prothrombin Time: 13.5
Prothrombin Time: 14.2
Prothrombin Time: 24.4 — ABNORMAL HIGH
Prothrombin Time: 25 — ABNORMAL HIGH

## 2011-07-02 LAB — POCT I-STAT 4, (NA,K, GLUC, HGB,HCT)
HCT: 32 — ABNORMAL LOW
Hemoglobin: 10.9 — ABNORMAL LOW
Potassium: 3.9
Sodium: 141

## 2011-07-02 LAB — HEPARIN LEVEL (UNFRACTIONATED)
Heparin Unfractionated: 0.25 — ABNORMAL LOW
Heparin Unfractionated: 0.34
Heparin Unfractionated: 0.35
Heparin Unfractionated: <0.1 — ABNORMAL LOW
Heparin Unfractionated: <0.1 — ABNORMAL LOW
Heparin Unfractionated: <0.1 — ABNORMAL LOW
Heparin Unfractionated: 0.25 — ABNORMAL LOW
Heparin Unfractionated: 0.54
Heparin Unfractionated: 0.59

## 2011-07-02 LAB — BASIC METABOLIC PANEL
BUN: 3 — ABNORMAL LOW
CO2: 27
Calcium: 8.3 — ABNORMAL LOW
Calcium: 9
Calcium: 8.4
Creatinine, Ser: 0.79
GFR calc Af Amer: >60
GFR calc Af Amer: >60
GFR calc non Af Amer: >60
GFR calc non Af Amer: >60
Glucose, Bld: 90
Glucose, Bld: 93
Potassium: 3.8
Sodium: 136

## 2011-07-02 LAB — CROSSMATCH

## 2011-07-02 LAB — URINALYSIS, ROUTINE W REFLEX MICROSCOPIC
Glucose, UA: NEGATIVE
Hgb urine dipstick: NEGATIVE
Protein, ur: NEGATIVE
Specific Gravity, Urine: 1.007
Urobilinogen, UA: 0.2

## 2011-07-02 LAB — FIBRINOGEN
Fibrinogen: 138 — ABNORMAL LOW
Fibrinogen: 235

## 2011-07-02 LAB — POTASSIUM: Potassium: 3.8

## 2011-07-12 NOTE — Discharge Summary (Signed)
  [name], [name] NO.:  [id number]  MEDICAL RECORD NO.:  [id number]  LOCATION:  5006                         FACILITY:  [facility]  PHYSICIAN:  Zannie Cove, MD     DATE OF BIRTH:  22-Jan-1966  DATE OF ADMISSION:  06/30/2011 DATE OF DISCHARGE:                              DISCHARGE SUMMARY   PRIMARY CARE PHYSICIAN:  [facility].  VASCULAR SURGEON:  Larina Earthly, MD  DISCHARGE DIAGNOSES: 1. Alcohol-induced pancreatitis, improved. 2. Severe peripheral arterial disease. 3. Prior history of arterial thrombosis and thrombectomy, on Coumadin. 4. Status post multiple vascular procedures in both legs. 5. Chronic pain in right and left lower extremity, secondary to     peripheral arterial disease. 6. Tobacco use. 7. History of anxiety disorder.  DISCHARGE MEDICATIONS: 1. Tylenol 650 mg q.4 h p.r.n. 2. Ambien 5 mg p.o. at bedtime p.r.n. 3. Oxycodone IR 10 mg p.o. q.4 h p.r.n., given a prescription of 20     tablets. 4. Aspirin 81 mg daily. 5. Coumadin 7.5 mg daily for the next four days, then based on INR,     July 06, 2011. 6. Ibuprofen 600 mg p.o. daily p.r.n. 7. Omeprazole 20 mg daily.  LABORATORY DATA:  Diagnostics investigations Shows chest x-ray June 30, 2011, no evidence of acute cardiopulmonary disease.  HOSPITAL COURSE:  Mr. [name] is a 45 year old white male, who was recently released from prison about 2 days ago.  Apparently, he had been drinking heavily and started having severe epigastric abdominal pain after half a pint of alcohol, subsequently came to the emergency room, was found to have elevated lipase level of 913 and admitted to the hospital for symptom control. 1. Alcohol-induced pancreatitis was treated with bowel rest, IV     fluids, and symptom control.  Subsequently, his diet was advanced     from clear liquids to a regular diet and pain control was achieved     with p.o. narcotics as well.  The patient is  advised on alcohol     cessation. 2. Severe peripheral arterial disease with unresectable arterial     disease in his left leg.  He has declined amputation in the past.     The patient complained of pain in his left leg through his hospital     course intermittently in the hospital, which was at his baseline.     He is advised regarding smoking cessation and follow up with Dr.     Arbie Cookey. 3. Rest of his chronic medical problems remained stable.  DISCHARGE FOLLOWUP: 1. Follow up with Dr. Arbie Cookey in 2 weeks. 2. At [facility]/primary care physician in 1 week,     needs an INR check on Friday, July 06, 2011.     Zannie Cove, MD     PJ/MEDQ  D:  07/02/2011  T:  07/02/2011  Job:  [id number]  cc:   [facility] Larina Earthly, M.D.  Electronically Signed by Zannie Cove  on 07/12/2011 01:58:27 PM

## 2011-07-23 ENCOUNTER — Inpatient Hospital Stay (HOSPITAL_COMMUNITY)
Admission: EM | Admit: 2011-07-23 | Discharge: 2011-07-31 | DRG: 282 | Disposition: A | Discharge status: Home / Self Care | Payer: Medicare Other | Source: Ambulatory Visit | Attending: Cardiovascular Disease | Admitting: Cardiovascular Disease

## 2011-07-23 DIAGNOSIS — F411 Generalized anxiety disorder: Secondary | ICD-10-CM | POA: Diagnosis present

## 2011-07-23 DIAGNOSIS — Z7982 Long term (current) use of aspirin: Secondary | ICD-10-CM

## 2011-07-23 DIAGNOSIS — E785 Hyperlipidemia, unspecified: Secondary | ICD-10-CM | POA: Diagnosis present

## 2011-07-23 DIAGNOSIS — Z7902 Long term (current) use of antithrombotics/antiplatelets: Secondary | ICD-10-CM

## 2011-07-23 DIAGNOSIS — I70209 Unspecified atherosclerosis of native arteries of extremities, unspecified extremity: Secondary | ICD-10-CM | POA: Diagnosis present

## 2011-07-23 DIAGNOSIS — F17200 Nicotine dependence, unspecified, uncomplicated: Secondary | ICD-10-CM | POA: Diagnosis present

## 2011-07-23 DIAGNOSIS — I2510 Atherosclerotic heart disease of native coronary artery without angina pectoris: Secondary | ICD-10-CM | POA: Diagnosis present

## 2011-07-23 DIAGNOSIS — G609 Hereditary and idiopathic neuropathy, unspecified: Secondary | ICD-10-CM | POA: Diagnosis present

## 2011-07-23 DIAGNOSIS — Z79899 Other long term (current) drug therapy: Secondary | ICD-10-CM

## 2011-07-23 DIAGNOSIS — I10 Essential (primary) hypertension: Secondary | ICD-10-CM | POA: Diagnosis present

## 2011-07-23 DIAGNOSIS — I2119 ST elevation (STEMI) myocardial infarction involving other coronary artery of inferior wall: Principal | ICD-10-CM | POA: Diagnosis present

## 2011-07-23 DIAGNOSIS — F1410 Cocaine abuse, uncomplicated: Secondary | ICD-10-CM | POA: Diagnosis present

## 2011-07-23 DIAGNOSIS — Z86718 Personal history of other venous thrombosis and embolism: Secondary | ICD-10-CM

## 2011-07-23 DIAGNOSIS — I5189 Other ill-defined heart diseases: Secondary | ICD-10-CM | POA: Diagnosis present

## 2011-07-23 DIAGNOSIS — Z7901 Long term (current) use of anticoagulants: Secondary | ICD-10-CM

## 2011-07-23 DIAGNOSIS — I959 Hypotension, unspecified: Secondary | ICD-10-CM | POA: Diagnosis not present

## 2011-07-23 LAB — CBC
Hemoglobin: 12.5 g/dL — ABNORMAL LOW (ref 13.0–17.0)
MCHC: 34.1 g/dL (ref 30.0–36.0)
RDW: 13.1 % (ref 11.5–15.5)

## 2011-07-23 LAB — CARDIAC PANEL(CRET KIN+CKTOT+MB+TROPI)
Relative Index: 5.6 — ABNORMAL HIGH (ref 0.0–2.5)
Total CK: 410 U/L — ABNORMAL HIGH (ref 7–232)
Troponin I: 10.65 ng/mL (ref <0.30)

## 2011-07-23 LAB — COMPREHENSIVE METABOLIC PANEL
Albumin: 3.3 g/dL — ABNORMAL LOW (ref 3.5–5.2)
BUN: 7 mg/dL (ref 6–23)
Creatinine, Ser: 0.72 mg/dL (ref 0.50–1.35)
Total Protein: 6 g/dL (ref 6.0–8.3)

## 2011-07-23 LAB — LIPASE, BLOOD: Lipase: 29 U/L (ref 11–59)

## 2011-07-23 LAB — D-DIMER, QUANTITATIVE: D-Dimer, Quant: 0.55 ug/mL-FEU — ABNORMAL HIGH (ref 0.00–0.48)

## 2011-07-24 LAB — POCT ACTIVATED CLOTTING TIME: Activated Clotting Time: 144 seconds

## 2011-07-24 LAB — CBC
HCT: 38.2 % — ABNORMAL LOW (ref 39.0–52.0)
Hemoglobin: 12.5 g/dL — ABNORMAL LOW (ref 13.0–17.0)
RDW: 13.5 % (ref 11.5–15.5)
WBC: 12 10*3/uL — ABNORMAL HIGH (ref 4.0–10.5)

## 2011-07-24 LAB — BASIC METABOLIC PANEL
BUN: 5 mg/dL — ABNORMAL LOW (ref 6–23)
Calcium: 8.6 mg/dL (ref 8.4–10.5)
Creatinine, Ser: 0.77 mg/dL (ref 0.50–1.35)
GFR calc Af Amer: >90 mL/min (ref >90)
GFR calc non Af Amer: >90 mL/min (ref >90)
Potassium: 4 mEq/L (ref 3.5–5.1)

## 2011-07-24 LAB — POCT I-STAT, CHEM 8
BUN: 5 mg/dL — ABNORMAL LOW (ref 6–23)
Chloride: 106 mEq/L (ref 96–112)
Creatinine, Ser: 0.7 mg/dL (ref 0.50–1.35)
Potassium: 3.4 mEq/L — ABNORMAL LOW (ref 3.5–5.1)
Sodium: 142 mEq/L (ref 135–145)

## 2011-07-24 LAB — CK TOTAL AND CKMB (NOT AT ARMC)
CK, MB: 38.4 ng/mL (ref 0.3–4.0)
Relative Index: 5.3 — ABNORMAL HIGH (ref 0.0–2.5)
Total CK: 723 U/L — ABNORMAL HIGH (ref 7–232)

## 2011-07-24 LAB — CARDIAC PANEL(CRET KIN+CKTOT+MB+TROPI)
CK, MB: 111.4 ng/mL (ref 0.3–4.0)
Total CK: 1653 U/L — ABNORMAL HIGH (ref 7–232)
Troponin I: >25 ng/mL (ref <0.30)

## 2011-07-24 LAB — TROPONIN I: Troponin I: 21.65 ng/mL (ref <0.30)

## 2011-07-24 LAB — LIPID PANEL: HDL: 38 mg/dL — ABNORMAL LOW (ref >39)

## 2011-07-24 LAB — HEMOGLOBIN A1C: Mean Plasma Glucose: 114 mg/dL (ref <117)

## 2011-07-25 LAB — CBC
MCHC: 32.8 g/dL (ref 30.0–36.0)
Platelets: 235 10*3/uL (ref 150–400)
RDW: 13.4 % (ref 11.5–15.5)
WBC: 9.6 10*3/uL (ref 4.0–10.5)

## 2011-07-25 LAB — BASIC METABOLIC PANEL
Calcium: 8.3 mg/dL — ABNORMAL LOW (ref 8.4–10.5)
GFR calc Af Amer: >90 mL/min (ref >90)
GFR calc non Af Amer: >90 mL/min (ref >90)
Potassium: 3.8 mEq/L (ref 3.5–5.1)
Sodium: 140 mEq/L (ref 135–145)

## 2011-07-25 LAB — DRUGS OF ABUSE SCREEN W/O ALC, ROUTINE URINE
Amphetamine Screen, Ur: NEGATIVE
Barbiturate Quant, Ur: NEGATIVE
Cocaine Metabolites: NEGATIVE
Creatinine,U: 22.6 mg/dL
Phencyclidine (PCP): NEGATIVE
Propoxyphene: NEGATIVE

## 2011-07-25 LAB — HEPARIN LEVEL (UNFRACTIONATED): Heparin Unfractionated: 0.21 IU/mL — ABNORMAL LOW (ref 0.30–0.70)

## 2011-07-26 LAB — CBC
MCH: 26.9 pg (ref 26.0–34.0)
MCHC: 32.6 g/dL (ref 30.0–36.0)
MCV: 83.4 fL (ref 78.0–100.0)
Platelets: 249 10*3/uL (ref 150–400)
Platelets: 249 10*3/uL (ref 150–400)
RBC: 5.05 MIL/uL (ref 4.22–5.81)
RDW: 13.5 % (ref 11.5–15.5)
RDW: 13.4 % (ref 11.5–15.5)
WBC: 9.5 10*3/uL (ref 4.0–10.5)

## 2011-07-26 LAB — PROTIME-INR
INR: 1.03 (ref 0.00–1.49)
Prothrombin Time: 13.7 seconds (ref 11.6–15.2)

## 2011-07-26 LAB — HOMOCYSTEINE: Homocysteine: 10.1 umol/L (ref 4.0–15.4)

## 2011-07-27 LAB — ANA: Anti Nuclear Antibody(ANA): NEGATIVE

## 2011-07-27 LAB — PROTIME-INR
INR: 1.27 (ref 0.00–1.49)
Prothrombin Time: 16.2 seconds — ABNORMAL HIGH (ref 11.6–15.2)

## 2011-07-27 NOTE — Cardiovascular Report (Signed)
NAMEIHOR, MEINZER NO.:  [id number]  MEDICAL RECORD NO.:  [id number]  LOCATION:  2912                         FACILITY:  [facility]  PHYSICIAN:  Nicki Guadalajara, M.D.     DATE OF BIRTH:  Jan 17, 1966  DATE OF PROCEDURE:  07/23/2011 DATE OF DISCHARGE:                           CARDIAC CATHETERIZATION   PROCEDURE:  Emergent cardiac catheterization.  INDICATIONS:  Mr. [name] is a 45 year old gentleman who has a history of peripheral vascular disease and is status post prior surgery including a right fem-to-posterior tibial bypass as well as prior thrombectomy and vein patch angioplasty of his left popliteal artery. The patient has a history of possible clotting disorder and had been on Coumadin.  He does have a history of tobacco use.  He states he recently was in jail and apparently had been off his Coumadin.  He has not been on that since being home over the past month.  He was at his computer when he developed severe retching, squeezing chest pain today associated with diaphoresis, nausea, and dizziness.  EMS arrived and he was found to have ST-segment elevation inferior MI.  Code STEMI was called and he was transported emergently to [facility] Cardiac catheterization Laboratory.  PROCEDURE:  Upon arrival, the patient was still having chest pain.  ECG had shown 2-mm of ST-segment elevation in leads 2, 3 and F.  He was given Versed 2 mg plus fentanyl 50 mcg.  He also was given 3000 units of heparin bolus. Right femoral artery was punctured anteriorly and a 6-French sheath was inserted without difficulty.  Catheterization was done utilizing 6- [place] Judkins 4 left and right coronary catheters.  Initially, it was uncertain where the acute vessel was.  The initial shot did suggest possible distal occlusion, a small branches of the circumflex, as well as spasm of the right coronary artery.  He did receive IC nitroglycerin down the right coronary artery.  A  6-French pigtail catheter was used for biplane cine left ventriculography.  Because of his peripheral vascular disease, distal aortography was also performed.  His left catheter was then reinserted and several additional views were made in the left circumflex system, which again seemed to suggest very small vessel distal occlusion of approximately 3 small branches suggesting embolic etiology with possible earlier spasm contributing to this.  He was treated with Integrilin bolus plus infusion and also was given Effient as well as IC nitroglycerin.  He was given additional medication for pain relief.  The vessels were felt to be very small and consequently not amenable to intervention.  The arterial sheath was then removed.  The patient will be transported to the CCU-TCU area with stable hemodynamics.  Central aortic pressure was 110/75, left ventricular pressure 110/9, post A-wave 15.  ANGIOGRAPHIC DATA:  Left main coronary artery was angiographically normal, bifurcated into an LAD and left circumflex system.  The LAD extended to and wrapped around the LV apex.  The LAD gave rise to several septal perforating arteries and proximal diagonal vessel. The circumflex vessel was a large caliber vessel.  Distally, there was a moderate-sized OM vessel.  After several small branches in this marginal vessel, there did appear  to be very distal occlusion with a nubbin of this site.  Further branch of this marginal vessel also seemed to have two additional branches with some subtotal stenosis in the midportion of the vessel less than 1.5 mm.  These did not open with IC nitroglycerin.  The right coronary artery initially seemed to suggest spasm in the mid region just above the acute margin.  The vessel gave rise to a small PDA and PLA system.  The spasm did somewhat improve with additional injections and following IC nitroglycerin.  Biplane cine left ventriculography revealed low-normal acute  dysfunction with focal distal inferior-to-apical hypocontractility on the RAO projection and inferoapical hypocontractility on the LAO projection. Distal aortography did not demonstrate renal artery stenosis.  He had otherwise normal aortoiliac system.  The distal vessels were not visualized at the site of his prior peripheral vascular distal bypass surgery.  IMPRESSION: 1. Acute ST-segment elevation myocardial infarction possibly secondary     to transient vasospasm, but with evidence for abrupt distal     occlusion of three distal branches of the circumflex vessel, which     was very small caliber, not amenable to intervention. 2. Normal left anterior descending artery system. 3. Mild spasm of the right coronary artery at the mid-segment, which     did improve following IC nitroglycerin. 4. Ejection fraction of 50-55% with focal distal inferior as well as     inferoapical hypocontractility.  RECOMMENDATION:  Mr. [name] will be treated with medical therapy.  He was started on IV nitroglycerin in addition to IIB/IIIA inhibition with Integrilin.  He also was given Effient.  Smoking cessation will be important.  The patient does admit to caffeine use.  He denies any recent drug use, although remotely had taken cocaine many months ago by his report.  He denies any over-the-counter pseudoephedrine preparations.          ______________________________ Nicki Guadalajara, M.D.     TK/MEDQ  D:  07/23/2011  T:  07/24/2011  Job:  [id number]  Electronically Signed by Nicki Guadalajara M.D. on 07/27/2011 03:34:14 PM

## 2011-07-28 ENCOUNTER — Inpatient Hospital Stay (HOSPITAL_COMMUNITY): Payer: Medicare Other

## 2011-07-28 LAB — CBC
HCT: 38.9 % — ABNORMAL LOW (ref 39.0–52.0)
MCH: 27.8 pg (ref 26.0–34.0)
MCV: 83.8 fL (ref 78.0–100.0)
Platelets: 237 10*3/uL (ref 150–400)
RDW: 13.3 % (ref 11.5–15.5)
WBC: 7.6 10*3/uL (ref 4.0–10.5)

## 2011-07-28 LAB — BASIC METABOLIC PANEL
BUN: 8 mg/dL (ref 6–23)
CO2: 28 mEq/L (ref 19–32)
Calcium: 8.6 mg/dL (ref 8.4–10.5)
Glucose, Bld: 80 mg/dL (ref 70–99)
Sodium: 140 mEq/L (ref 135–145)

## 2011-07-29 LAB — PROTIME-INR
INR: 1.48 (ref 0.00–1.49)
Prothrombin Time: 18.2 seconds — ABNORMAL HIGH (ref 11.6–15.2)

## 2011-07-29 LAB — CBC
Hemoglobin: 13.8 g/dL (ref 13.0–17.0)
MCV: 82.9 fL (ref 78.0–100.0)
RBC: 5.02 MIL/uL (ref 4.22–5.81)
RDW: 13.4 % (ref 11.5–15.5)

## 2011-07-29 LAB — BENZODIAZEPINE, QUANTITATIVE, URINE
Alprazolam (GC/LC/MS), ur confirm: NEGATIVE NG/ML
Flurazepam GC/MS Conf: NEGATIVE NG/ML
Nordiazepam GC/MS Conf: NEGATIVE NG/ML
Oxazepam GC/MS Conf: NEGATIVE NG/ML
Temazepam GC/MS Conf: NEGATIVE NG/ML

## 2011-07-29 LAB — OPIATE, QUANTITATIVE, URINE
Hydrocodone: NEGATIVE NG/ML
Morphine, Confirm: 800 NG/ML — ABNORMAL HIGH

## 2011-07-30 ENCOUNTER — Inpatient Hospital Stay (HOSPITAL_COMMUNITY): Payer: Medicare Other

## 2011-07-30 LAB — CBC
MCH: 26.8 pg (ref 26.0–34.0)
MCHC: 31.9 g/dL (ref 30.0–36.0)
Platelets: 242 10*3/uL (ref 150–400)
RDW: 13.3 % (ref 11.5–15.5)

## 2011-07-30 LAB — PROTIME-INR
INR: 1.5 — ABNORMAL HIGH (ref 0.00–1.49)
Prothrombin Time: 18.4 seconds — ABNORMAL HIGH (ref 11.6–15.2)

## 2011-07-31 LAB — CBC
HCT: 42.6 % (ref 39.0–52.0)
MCH: 27.2 pg (ref 26.0–34.0)
MCV: 83.9 fL (ref 78.0–100.0)
Platelets: 248 10*3/uL (ref 150–400)
RDW: 13.2 % (ref 11.5–15.5)

## 2011-07-31 LAB — BASIC METABOLIC PANEL
BUN: 8 mg/dL (ref 6–23)
Calcium: 8.7 mg/dL (ref 8.4–10.5)
Creatinine, Ser: 0.8 mg/dL (ref 0.50–1.35)
GFR calc Af Amer: >90 mL/min (ref >90)

## 2011-08-06 ENCOUNTER — Inpatient Hospital Stay (HOSPITAL_COMMUNITY)
Admission: EM | Admit: 2011-08-06 | Discharge: 2011-08-10 | DRG: 313 | Disposition: A | Discharge status: Home / Self Care | Payer: Medicare Other | Attending: Internal Medicine | Admitting: Internal Medicine

## 2011-08-06 ENCOUNTER — Encounter: Payer: Self-pay | Admitting: *Deleted

## 2011-08-06 ENCOUNTER — Other Ambulatory Visit: Payer: Self-pay

## 2011-08-06 ENCOUNTER — Emergency Department (HOSPITAL_COMMUNITY): Payer: Medicare Other

## 2011-08-06 DIAGNOSIS — Z8249 Family history of ischemic heart disease and other diseases of the circulatory system: Secondary | ICD-10-CM

## 2011-08-06 DIAGNOSIS — E785 Hyperlipidemia, unspecified: Secondary | ICD-10-CM | POA: Diagnosis present

## 2011-08-06 DIAGNOSIS — Z79899 Other long term (current) drug therapy: Secondary | ICD-10-CM

## 2011-08-06 DIAGNOSIS — Z86718 Personal history of other venous thrombosis and embolism: Secondary | ICD-10-CM

## 2011-08-06 DIAGNOSIS — I739 Peripheral vascular disease, unspecified: Secondary | ICD-10-CM | POA: Diagnosis present

## 2011-08-06 DIAGNOSIS — Z7982 Long term (current) use of aspirin: Secondary | ICD-10-CM

## 2011-08-06 DIAGNOSIS — F17200 Nicotine dependence, unspecified, uncomplicated: Secondary | ICD-10-CM | POA: Insufficient documentation

## 2011-08-06 DIAGNOSIS — K449 Diaphragmatic hernia without obstruction or gangrene: Secondary | ICD-10-CM | POA: Diagnosis present

## 2011-08-06 DIAGNOSIS — I219 Acute myocardial infarction, unspecified: Secondary | ICD-10-CM | POA: Diagnosis present

## 2011-08-06 DIAGNOSIS — J45909 Unspecified asthma, uncomplicated: Secondary | ICD-10-CM | POA: Diagnosis present

## 2011-08-06 DIAGNOSIS — K5900 Constipation, unspecified: Secondary | ICD-10-CM | POA: Diagnosis present

## 2011-08-06 DIAGNOSIS — R0789 Other chest pain: Principal | ICD-10-CM | POA: Diagnosis present

## 2011-08-06 DIAGNOSIS — Z7901 Long term (current) use of anticoagulants: Secondary | ICD-10-CM

## 2011-08-06 DIAGNOSIS — F419 Anxiety disorder, unspecified: Secondary | ICD-10-CM | POA: Diagnosis present

## 2011-08-06 DIAGNOSIS — I2510 Atherosclerotic heart disease of native coronary artery without angina pectoris: Secondary | ICD-10-CM | POA: Diagnosis present

## 2011-08-06 DIAGNOSIS — R079 Chest pain, unspecified: Secondary | ICD-10-CM | POA: Insufficient documentation

## 2011-08-06 DIAGNOSIS — F4310 Post-traumatic stress disorder, unspecified: Secondary | ICD-10-CM | POA: Diagnosis present

## 2011-08-06 DIAGNOSIS — I252 Old myocardial infarction: Secondary | ICD-10-CM

## 2011-08-06 DIAGNOSIS — F411 Generalized anxiety disorder: Secondary | ICD-10-CM | POA: Diagnosis present

## 2011-08-06 DIAGNOSIS — G609 Hereditary and idiopathic neuropathy, unspecified: Secondary | ICD-10-CM | POA: Diagnosis present

## 2011-08-06 DIAGNOSIS — I240 Acute coronary thrombosis not resulting in myocardial infarction: Secondary | ICD-10-CM | POA: Diagnosis present

## 2011-08-06 DIAGNOSIS — K219 Gastro-esophageal reflux disease without esophagitis: Secondary | ICD-10-CM | POA: Diagnosis present

## 2011-08-06 HISTORY — DX: Old myocardial infarction: I25.2

## 2011-08-06 HISTORY — DX: Acute embolism and thrombosis of unspecified deep veins of lower extremity, bilateral: I82.403

## 2011-08-06 LAB — PROTIME-INR: INR: 1.64 — ABNORMAL HIGH (ref 0.00–1.49)

## 2011-08-06 LAB — URINALYSIS, ROUTINE W REFLEX MICROSCOPIC
Glucose, UA: NEGATIVE mg/dL
Hgb urine dipstick: NEGATIVE
Ketones, ur: NEGATIVE mg/dL
Protein, ur: NEGATIVE mg/dL

## 2011-08-06 LAB — POCT I-STAT TROPONIN I: Troponin i, poc: 0 ng/mL (ref 0.00–0.08)

## 2011-08-06 LAB — COMPREHENSIVE METABOLIC PANEL
ALT: 32 U/L (ref 0–53)
AST: 17 U/L (ref 0–37)
Albumin: 3.6 g/dL (ref 3.5–5.2)
Alkaline Phosphatase: 82 U/L (ref 39–117)
BUN: 12 mg/dL (ref 6–23)
CO2: 29 mEq/L (ref 19–32)
Calcium: 8.6 mg/dL (ref 8.4–10.5)
Chloride: 106 mEq/L (ref 96–112)
Creatinine, Ser: 0.85 mg/dL (ref 0.50–1.35)
GFR calc Af Amer: >90 mL/min (ref >90)
GFR calc non Af Amer: >90 mL/min (ref >90)
Glucose, Bld: 82 mg/dL (ref 70–99)
Potassium: 4.2 mEq/L (ref 3.5–5.1)
Sodium: 142 mEq/L (ref 135–145)
Total Bilirubin: 0.2 mg/dL — ABNORMAL LOW (ref 0.3–1.2)
Total Protein: 6.7 g/dL (ref 6.0–8.3)

## 2011-08-06 LAB — CBC
HCT: 37.1 % — ABNORMAL LOW (ref 39.0–52.0)
HCT: 39.3 % (ref 39.0–52.0)
Hemoglobin: 12.4 g/dL — ABNORMAL LOW (ref 13.0–17.0)
Hemoglobin: 13 g/dL (ref 13.0–17.0)
MCH: 27.3 pg (ref 26.0–34.0)
MCHC: 33.1 g/dL (ref 30.0–36.0)
MCV: 81.7 fL (ref 78.0–100.0)
MCV: 82.4 fL (ref 78.0–100.0)
Platelets: 249 10*3/uL (ref 150–400)
RBC: 4.77 MIL/uL (ref 4.22–5.81)
RDW: 13.2 % (ref 11.5–15.5)
WBC: 10.2 10*3/uL (ref 4.0–10.5)
WBC: 8.3 10*3/uL (ref 4.0–10.5)

## 2011-08-06 LAB — RAPID URINE DRUG SCREEN, HOSP PERFORMED
Amphetamines: NOT DETECTED
Benzodiazepines: NOT DETECTED
Cocaine: NOT DETECTED
Opiates: POSITIVE — AB

## 2011-08-06 LAB — BASIC METABOLIC PANEL
CO2: 27 mEq/L (ref 19–32)
Chloride: 107 mEq/L (ref 96–112)
Potassium: 4.6 mEq/L (ref 3.5–5.1)
Sodium: 142 mEq/L (ref 135–145)

## 2011-08-06 LAB — APTT: aPTT: 35 seconds (ref 24–37)

## 2011-08-06 LAB — D-DIMER, QUANTITATIVE: D-Dimer, Quant: 0.36 ug/mL-FEU (ref 0.00–0.48)

## 2011-08-06 LAB — DIFFERENTIAL
Basophils Absolute: 0.1 10*3/uL (ref 0.0–0.1)
Lymphocytes Relative: 25 % (ref 12–46)
Neutro Abs: 6.7 10*3/uL (ref 1.7–7.7)

## 2011-08-06 LAB — CARDIAC PANEL(CRET KIN+CKTOT+MB+TROPI): Relative Index: INVALID (ref 0.0–2.5)

## 2011-08-06 MED ORDER — NITROGLYCERIN 0.4 MG SL SUBL: 0.4000 mg | SUBLINGUAL_TABLET | SUBLINGUAL | Status: DC | PRN

## 2011-08-06 MED ORDER — SODIUM CHLORIDE 0.9 % IJ SOLN: 3.0000 mL | INTRAMUSCULAR | Status: DC | PRN

## 2011-08-06 MED ORDER — SODIUM CHLORIDE 0.9 % IJ SOLN
3.0000 mL | Freq: Two times a day (BID) | INTRAMUSCULAR | Status: DC
  Administered 2011-08-07 – 2011-08-10 (×8): 3 mL via INTRAVENOUS

## 2011-08-06 MED ORDER — MORPHINE SULFATE 4 MG/ML IJ SOLN
4.0000 mg | Freq: Once | INTRAMUSCULAR | Status: AC
  Administered 2011-08-06: 4 mg via INTRAVENOUS
  Filled 2011-08-06: qty 1

## 2011-08-06 MED ORDER — ACETAMINOPHEN 325 MG PO TABS: 650.0000 mg | ORAL_TABLET | ORAL | Status: DC | PRN

## 2011-08-06 MED ORDER — ASPIRIN EC 81 MG PO TBEC
81.0000 mg | DELAYED_RELEASE_TABLET | Freq: Every day | ORAL | Status: DC
  Administered 2011-08-07 – 2011-08-10 (×4): 81 mg via ORAL
  Filled 2011-08-06 (×4): qty 1

## 2011-08-06 MED ORDER — WARFARIN SODIUM 7.5 MG PO TABS
7.5000 mg | ORAL_TABLET | Freq: Every day | ORAL | Status: DC
  Administered 2011-08-07: 7.5 mg via ORAL
  Filled 2011-08-06 (×5): qty 1

## 2011-08-06 MED ORDER — ROSUVASTATIN CALCIUM 40 MG PO TABS
40.0000 mg | ORAL_TABLET | Freq: Every day | ORAL | Status: DC
  Administered 2011-08-07 – 2011-08-10 (×4): 40 mg via ORAL
  Filled 2011-08-06 (×4): qty 1

## 2011-08-06 MED ORDER — LORATADINE 10 MG PO TABS
10.0000 mg | ORAL_TABLET | Freq: Every day | ORAL | Status: DC
  Administered 2011-08-07 – 2011-08-10 (×4): 10 mg via ORAL
  Filled 2011-08-06 (×4): qty 1

## 2011-08-06 MED ORDER — PANTOPRAZOLE SODIUM 40 MG PO TBEC
40.0000 mg | DELAYED_RELEASE_TABLET | Freq: Every day | ORAL | Status: DC
  Administered 2011-08-07 – 2011-08-08 (×2): 40 mg via ORAL
  Filled 2011-08-06 (×2): qty 1

## 2011-08-06 MED ORDER — OXYCODONE-ACETAMINOPHEN 5-325 MG PO TABS
1.0000 | ORAL_TABLET | Freq: Four times a day (QID) | ORAL | Status: DC | PRN
  Administered 2011-08-06: 1 via ORAL
  Administered 2011-08-07 – 2011-08-08 (×2): 2 via ORAL
  Filled 2011-08-06 (×3): qty 2

## 2011-08-06 MED ORDER — NITROGLYCERIN 2 % TD OINT
0.5000 [in_us] | TOPICAL_OINTMENT | Freq: Four times a day (QID) | TRANSDERMAL | Status: DC
  Administered 2011-08-06: 66.6667 [in_us] via TOPICAL
  Filled 2011-08-06 (×12): qty 30
  Filled 2011-08-06: qty 1

## 2011-08-06 MED ORDER — ALUM & MAG HYDROXIDE-SIMETH 200-200-20 MG/5ML PO SUSP: 15.0000 mL | ORAL | Status: DC | PRN

## 2011-08-06 MED ORDER — ZOLPIDEM TARTRATE 5 MG PO TABS: 10.0000 mg | ORAL_TABLET | Freq: Every evening | ORAL | Status: DC | PRN

## 2011-08-06 MED ORDER — ASPIRIN 300 MG RE SUPP: 300.0000 mg | RECTAL | Status: AC

## 2011-08-06 MED ORDER — NITROGLYCERIN 2 % TD OINT
0.5000 [in_us] | TOPICAL_OINTMENT | Freq: Four times a day (QID) | TRANSDERMAL | Status: DC
  Administered 2011-08-07 (×2): 0.5 [in_us] via TOPICAL
  Filled 2011-08-06: qty 30

## 2011-08-06 MED ORDER — LOPERAMIDE HCL 2 MG PO CAPS: 4.0000 mg | ORAL_CAPSULE | Freq: Once | ORAL | Status: AC | PRN

## 2011-08-06 MED ORDER — ONDANSETRON HCL 4 MG/2ML IJ SOLN: 4.0000 mg | Freq: Four times a day (QID) | INTRAMUSCULAR | Status: DC | PRN

## 2011-08-06 MED ORDER — ALPRAZOLAM 0.25 MG PO TABS
0.2500 mg | ORAL_TABLET | Freq: Three times a day (TID) | ORAL | Status: DC | PRN
  Administered 2011-08-06: 0.25 mg via ORAL
  Filled 2011-08-06: qty 1

## 2011-08-06 MED ORDER — GUAIFENESIN-DM 100-10 MG/5ML PO SYRP: 15.0000 mL | ORAL_SOLUTION | ORAL | Status: DC | PRN

## 2011-08-06 MED ORDER — PRASUGREL HCL 10 MG PO TABS
10.0000 mg | ORAL_TABLET | Freq: Every day | ORAL | Status: DC
  Administered 2011-08-07 – 2011-08-10 (×4): 10 mg via ORAL
  Filled 2011-08-06 (×6): qty 1

## 2011-08-06 MED ORDER — SODIUM CHLORIDE 0.9 % IV SOLN
20.0000 mL | INTRAVENOUS | Status: DC
  Administered 2011-08-06 (×2): 20 mL via INTRAVENOUS

## 2011-08-06 MED ORDER — PRAMOXINE HCL 1 % RE OINT
1.0000 "application " | TOPICAL_OINTMENT | Freq: Three times a day (TID) | RECTAL | Status: DC | PRN
  Filled 2011-08-06: qty 30

## 2011-08-06 MED ORDER — SODIUM CHLORIDE 0.9 % IV SOLN
250.0000 mL | INTRAVENOUS | Status: DC
  Administered 2011-08-06 (×2): 250 mL via INTRAVENOUS

## 2011-08-06 NOTE — ED Notes (Signed)
Patient is resting comfortably. 

## 2011-08-06 NOTE — ED Notes (Signed)
MOrphine given now because SBP has been to low

## 2011-08-06 NOTE — ED Notes (Signed)
PT unable to void at this time. 

## 2011-08-06 NOTE — ED Notes (Signed)
Pt was given 4 -81mg  ASA before arrival to ED

## 2011-08-06 NOTE — ED Notes (Signed)
Attempted to call report to 2000 RN - unable to take at this time

## 2011-08-06 NOTE — H&P (Signed)
Pt. Seen and examined. Agree with the NP/PA-C note as written. Atypical chest pressure, definite anxiety. Recent negative cath. LV thrombus, on coumadin. Mildly subtherapeutic. Will Rx with lovenox. R/O ACS overnight. May need outpatient NST this week.

## 2011-08-06 NOTE — H&P (Signed)
[name] is an 45 y.o. male.   Chief Complaint: Chest pain HPI: 45 y/o just d/c'd 07/31/2011 after adm for STEMI presumed secondary to CFX spasm. The patient has some CAD at cath in the distal CFX. Plan was for medical Rx. @d  echo showed LVT and he was put on lovenox to coumadin prior to d/c. He did well after d/c till noon today when he developed sscp "pressure" while at rest. He feels the pain is different from his previous adm. He took Ntg x 2 at home without relief. In ER now he continues to c/o mild "2 out of 10" chest pressure.  Past Medical History  Diagnosis Date  . PVD, Rt Fem-Tib bpg 7/09, with revision 4/10, 2/11   . 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            Past Surgical History  Procedure Date  . Cholecystectomy   . Femoral bypass     info per pt    History reviewed. No pertinent family history. Social History:  reports that he has been smoking Cigarettes.  He has been smoking about 1 pack per day. He reports that he does not drink alcohol or use illicit drugs at this time.Marland Kitchen He does have a past past history of drug and alcohol use. His drug screen was negative on his last adm.  Allergies: No Known Allergies  Medications Prior to Admission  Medication Dose Route Frequency Provider Last Rate Last Dose  . 0.9 %  sodium chloride infusion  20 mL Intravenous Continuous Grant Fontana, PA 20 mL/hr at 08/06/11 1345 20 mL at 08/06/11 1345  . morphine 4 MG/ML injection 4 mg  4 mg Intravenous Once Grant Fontana, [place]   4 mg at 08/06/11 1604   No current outpatient prescriptions on file as of 08/06/2011. Patient discharged on the following 07/31/2011  Robaxin 1500mg  QID Nicoderm patch 21mg  daily Percocet 5-325 1 to 2 Q6hrs prn Effient 10mg  daily Crestor 40mg  dail Prilosec  20mg  daily Warfarin 7.5mg  daily NTG prn    Results for orders placed during the hospital encounter of 08/06/11 (from the past 48 hour(s))  CARDIAC PANEL(CRET KIN+CKTOT+MB+TROPI)     Status: Normal   Collection Time   08/06/11  3:16 PM      Component Value Range Comment   Total CK 85  7 - 232 (U/L)    CK, MB 3.3  0.3 - 4.0 (ng/mL)    Troponin I <0.30  <0.30 (ng/mL)    Relative Index RELATIVE INDEX IS INVALID  0.0 - 2.5    CBC     Status: Abnormal   Collection Time   08/06/11  3:17 PM      Component Value Range Comment   WBC 10.2  4.0 - 10.5 (K/uL)    RBC 4.54  4.22 - 5.81 (MIL/uL)    Hemoglobin 12.4 (*) 13.0 - 17.0 (g/dL)    HCT 40.9 (*) 81.1 - 52.0 (%)    MCV 81.7  78.0 - 100.0 (fL)    MCH 27.3  26.0 - 34.0 (pg)    MCHC 33.4  30.0 - 36.0 (g/dL)    RDW 91.4  78.2 - 95.6 (%)    Platelets 267  150 - 400 (K/uL)   PROTIME-INR     Status:  Abnormal   Collection Time   08/06/11  3:17 PM      Component Value Range Comment   Prothrombin Time 19.7 (*) 11.6 - 15.2 (seconds)    INR 1.64 (*) 0.00 - 1.49    APTT     Status: Normal   Collection Time   08/06/11  3:17 PM      Component Value Range Comment   aPTT 35  24 - 37 (seconds)   DIFFERENTIAL     Status: Normal   Collection Time   08/06/11  3:17 PM      Component Value Range Comment   Neutrophils Relative 65  43 - 77 (%)    Neutro Abs 6.7  1.7 - 7.7 (K/uL)    Lymphocytes Relative 25  12 - 46 (%)    Lymphs Abs 2.5  0.7 - 4.0 (K/uL)    Monocytes Relative 6  3 - 12 (%)    Monocytes Absolute 0.6  0.1 - 1.0 (K/uL)    Eosinophils Relative 4  0 - 5 (%)    Eosinophils Absolute 0.4  0.0 - 0.7 (K/uL)    Basophils Relative 1  0 - 1 (%)    Basophils Absolute 0.1  0.0 - 0.1 (K/uL)   BASIC METABOLIC PANEL     Status: Normal   Collection Time   08/06/11  3:17 PM      Component Value Range Comment   Sodium 142  135 - 145 (mEq/L)    Potassium 4.6  3.5 - 5.1 (mEq/L)    Chloride 107  96 - 112 (mEq/L)    CO2 27  19 - 32 (mEq/L)    Glucose, Bld  88  70 - 99 (mg/dL)    BUN 13  6 - 23 (mg/dL)    Creatinine, Ser 0.45  0.50 - 1.35 (mg/dL)    Calcium 8.5  8.4 - 10.5 (mg/dL)    GFR calc non Af Amer >90  >90 (mL/min)    GFR calc Af Amer >90  >90 (mL/min)   D-DIMER, QUANTITATIVE     Status: Normal   Collection Time   08/06/11  3:17 PM      Component Value Range Comment   D-Dimer, Quant 0.36  0.00 - 0.48 (ug/mL-FEU)    Dg Chest Portable 1 View  08/06/2011  *RADIOLOGY REPORT*  Clinical Data: Chest pain and pressure, shortness of breath, history smoking  PORTABLE CHEST - 1 VIEW  Comparison: Portable exam 1350 hours compared to 06/30/2011  Findings: Upper normal heart size. Normal mediastinal contours and pulmonary vascularity. Bronchitic changes with bibasilar atelectasis, greater on the right. Remaining lungs clear. No pleural effusion or pneumothorax. No acute osseous findings.  IMPRESSION: Bronchitic changes with bibasilar atelectasis, greater on the right.  Original Report Authenticated By: Lollie Marrow, M.D.   ROS unremarkable except where noted above. He did have problems with back pain requiring narcotics during his last adm. MRI of his back was negative.  Blood pressure 90/58, pulse 76, temperature 98.7 F (37.1 C), temperature source Oral, resp. rate 16, height 5\' 10"  (1.778 m), SpO2 99.00%. Gen: Well developed male NAD HEENT: Normocephalic Neck: Without JVD Chest: Clear Cardiac: RRR without murmur or rub Abd: ecchymosis from Lovenox injections Extrem: without edema Neuro: Intact  unchanged from previous tracings, NSR  Inferior Q's and TWI  Assessment/Plan  Chest Pain, r/o cardiac CAD, STEMI 07/23/2011, Med Rx of distal CFX disease Hx PVD with prior fem-tib bpg LV Thrombus by echo post-MI 07/23/2011 Coumadin  Rx, subtheraputic Dyslipidemia with low HDL, LDL 56 Smoking History of Peripheral Neuropathy Pancreatitis 9/12 secondary to ETOH  Plan: Admit to r/o MI. Currently b/p too low to tolerate Calcium blocker. Will give  one dose of Lovenox and continue to follow INR. Consider O.P. Myoview if enzymes negative.   Corine Shelter K PA-C 08/06/2011, 4:56 PM

## 2011-08-06 NOTE — ED Provider Notes (Signed)
History     CSN: [id number] Arrival date & time: 08/06/2011  1:21 PM   First MD Initiated Contact with Patient 08/06/11 1344      Chief Complaint  Patient presents with  . Chest Pain    (Consider location/radiation/quality/duration/timing/severity/associated sxs/prior treatment) Patient is a 45 y.o. male presenting with chest pain. The history is provided by the patient.  Chest Pain The chest pain began 3 - 5 hours ago. Chest pain occurs constantly. The chest pain is improving. The pain is associated with stress. At its most intense, the pain is at 7/10. The pain is currently at 4/10. The quality of the pain is described as aching. The pain does not radiate. Chest pain is worsened by stress. Primary symptoms include nausea and dizziness. Pertinent negatives for primary symptoms include no fever, no fatigue, no cough and no wheezing.  Dizziness also occurs with nausea. Dizziness does not occur with weakness or diaphoresis.   Associated symptoms include near-syncope.  Pertinent negatives for associated symptoms include no diaphoresis, no lower extremity edema, no numbness, no orthopnea and no weakness. He tried nothing for the symptoms. Risk factors include sedentary lifestyle, stress and male gender.  His past medical history is significant for MI.  His family medical history is significant for CAD in family.  Procedure history is positive for cardiac catheterization and echocardiogram.     Past Medical History  Diagnosis Date  . Asthma   . Myocardial infarct, old     pt D/C  from [facility] ago  . DVT of lower extremity, bilateral     PT reports a HX of  Bil DVT . PT takes warfarin  andeffient    Past Surgical History  Procedure Date  . Cholecystectomy   . Femoral bypass     info per pt    History reviewed. No pertinent family history.  History  Substance Use Topics  . Smoking status: Current Everyday Smoker -- 1.0 packs/day    Types: Cigarettes  . Smokeless tobacco:  Not on file  . Alcohol Use: No      Review of Systems  Constitutional: Negative for fever, diaphoresis and fatigue.  Respiratory: Negative for cough and wheezing.   Cardiovascular: Positive for chest pain and near-syncope. Negative for orthopnea.  Gastrointestinal: Positive for nausea.  Neurological: Positive for dizziness. Negative for weakness and numbness.  All other systems reviewed and are negative.    Allergies  Review of patient's allergies indicates no known allergies.  Home Medications   Current Outpatient Rx  Name Route Sig Dispense Refill  . METHOCARBAMOL 750 MG PO TABS Oral Take 750 mg by mouth 4 (four) times daily. 2 tabs PO 4 times a day    . NICOTINE 21 MG/24HR TD PT24 Transdermal Place 1 patch onto the skin daily.     Marland Kitchen OMEPRAZOLE MAGNESIUM 20.6 (20 BASE) MG PO CPDR Oral Take 1 capsule by mouth daily.      . OXYCODONE-ACETAMINOPHEN 5-325 MG PO TABS Oral Take 1-2 tablets by mouth every 6 (six) hours as needed. For pain    . PRASUGREL HCL 10 MG PO TABS Oral Take 10 mg by mouth daily.     Marland Kitchen ROSUVASTATIN CALCIUM 40 MG PO TABS Oral Take 40 mg by mouth daily.     . WARFARIN SODIUM 5 MG PO TABS Oral Take 5-10 mg by mouth daily. Take 2 tablets on Monday, Wednesday, Friday and then take 1.5 tablets all other days     . OMEPRAZOLE 20  MG PO CPDR Oral Take 20 mg by mouth daily.     . WARFARIN SODIUM 5 MG PO TABS Oral Take 5 mg by mouth daily. Take 1.5 tabs PO q day      BP 90/58  Pulse 76  Temp(Src) 98.7 F (37.1 C) (Oral)  Resp 16  Ht 5\' 10"  (1.778 m)  SpO2 99%  Physical Exam  Constitutional: He is oriented to person, place, and time. He appears well-developed and well-nourished.  HENT:  Head: Normocephalic and atraumatic.  Eyes: Pupils are equal, round, and reactive to light. No scleral icterus.  Neck: Normal range of motion. Neck supple.  Cardiovascular: Normal rate, regular rhythm and normal heart sounds.   Pulmonary/Chest: Effort normal and breath sounds  normal.  Abdominal: Bowel sounds are normal.  Musculoskeletal: He exhibits no edema and no tenderness.  Neurological: He is alert and oriented to person, place, and time. He displays abnormal reflex. No cranial nerve deficit. He exhibits normal muscle tone. Coordination normal.  Skin: No rash noted. No erythema. No pallor.  Psychiatric: He has a normal mood and affect.    ED Course  Procedures (including critical care time)  Date: 08/06/2011  Rate: 79  Rhythm: normal sinus rhythm  QRS Axis: right  Intervals: normal  ST/T Wave abnormalities: normal  Conduction Disutrbances:none  Narrative Interpretation: Q Waves inferiorly  Old EKG Reviewed: unchanged   Labs Reviewed  CARDIAC PANEL(CRET KIN+CKTOT+MB+TROPI)  CBC  I-STAT, CHEM 8  PROTIME-INR  APTT  DIFFERENTIAL  BASIC METABOLIC PANEL  D-DIMER, QUANTITATIVE  I-STAT TROPONIN I  URINE RAPID DRUG SCREEN (HOSP PERFORMED)  URINALYSIS, ROUTINE W REFLEX MICROSCOPIC   Dg Chest Portable 1 View  08/06/2011  *RADIOLOGY REPORT*  Clinical Data: Chest pain and pressure, shortness of breath, history smoking  PORTABLE CHEST - 1 VIEW  Comparison: Portable exam 1350 hours compared to 06/30/2011  Findings: Upper normal heart size. Normal mediastinal contours and pulmonary vascularity. Bronchitic changes with bibasilar atelectasis, greater on the right. Remaining lungs clear. No pleural effusion or pneumothorax. No acute osseous findings.  IMPRESSION: Bronchitic changes with bibasilar atelectasis, greater on the right.  Original Report Authenticated By: Lollie Marrow, M.D.     No diagnosis found.    MDM  Nonspecific chest pain, with a recent myocardial infarction, and negative murmurs. Department evaluation today. INR is therapeutic at 1.64 he is taking his aspirin and Coumadin and effient. There is a component of stress that may well be playing into this presentation. The patient is to be evaluated in the emergency department by cardiology prior  to discharge        Flint Melter, MD 08/07/11 [phone]

## 2011-08-07 DIAGNOSIS — I240 Acute coronary thrombosis not resulting in myocardial infarction: Secondary | ICD-10-CM | POA: Diagnosis present

## 2011-08-07 DIAGNOSIS — I2510 Atherosclerotic heart disease of native coronary artery without angina pectoris: Secondary | ICD-10-CM | POA: Diagnosis present

## 2011-08-07 LAB — PROTIME-INR: INR: 1.86 — ABNORMAL HIGH (ref 0.00–1.49)

## 2011-08-07 MED ORDER — LORAZEPAM 2 MG/ML PO CONC: 0.5000 mg | Freq: Two times a day (BID) | ORAL | Status: DC

## 2011-08-07 MED ORDER — ENOXAPARIN SODIUM 100 MG/ML ~~LOC~~ SOLN
90.0000 mg | Freq: Two times a day (BID) | SUBCUTANEOUS | Status: DC
  Administered 2011-08-07 – 2011-08-09 (×4): 90 mg via SUBCUTANEOUS
  Administered 2011-08-10: 100 mg via SUBCUTANEOUS
  Filled 2011-08-07 (×10): qty 1

## 2011-08-07 MED ORDER — LORAZEPAM 0.5 MG PO TABS
0.5000 mg | ORAL_TABLET | Freq: Two times a day (BID) | ORAL | Status: DC
  Administered 2011-08-07 – 2011-08-08 (×2): 0.5 mg via ORAL
  Filled 2011-08-07 (×2): qty 1

## 2011-08-07 MED ORDER — ISOSORBIDE MONONITRATE 15 MG HALF TABLET
15.0000 mg | ORAL_TABLET | Freq: Every day | ORAL | Status: DC
  Administered 2011-08-07 – 2011-08-10 (×4): 15 mg via ORAL
  Filled 2011-08-07 (×4): qty 1

## 2011-08-07 NOTE — Progress Notes (Signed)
[facility] DAILY PROGRESS NOTE  [name]   [id number] 03-Aug-1966     Subjective:  Chest tightness improved  Objective:  Temp:  [97.1 F (36.2 C)-97.7 F (36.5 C)] 97.1 F (36.2 C) (11/06 1300) Pulse Rate:  [60-81] 81  (11/06 1300) Resp:  [13-15] 14  (11/06 1300) BP: (80-108)/(51-66) 97/65 mmHg (11/06 1300) SpO2:  [89 %-98 %] 89 % (11/06 1300) Weight:  [83.3 kg (183 lb 10.3 oz)] 183 lb 10.3 oz (83.3 kg) (11/06 1220) Weight change:   Intake/Output from previous day: 11/05 0701 - 11/06 0700 In: -  Out: 400 [Urine:400] Intake/Output from this shift: Total I/O In: 960 [P.O.:960] Out: -   Physical Exam: General appearance: alert, cooperative and no distress Neck: no JVD Lungs: clear to auscultation bilaterally Heart: regular rate and rhythm and no rub Extremities: No edema.  Right groin cath site stable.  Lab Results: Results for orders placed during the hospital encounter of 08/06/11 (from the past 48 hour(s))  CARDIAC PANEL(CRET KIN+CKTOT+MB+TROPI)     Status: Normal   Collection Time   08/06/11  3:16 PM      Component Value Range Comment   Total CK 85  7 - 232 (U/L)    CK, MB 3.3  0.3 - 4.0 (ng/mL)    Troponin I <0.30  <0.30 (ng/mL)    Relative Index RELATIVE INDEX IS INVALID  0.0 - 2.5    CBC     Status: Abnormal   Collection Time   08/06/11  3:17 PM      Component Value Range Comment   WBC 10.2  4.0 - 10.5 (K/uL)    RBC 4.54  4.22 - 5.81 (MIL/uL)    Hemoglobin 12.4 (*) 13.0 - 17.0 (g/dL)    HCT 41.3 (*) 24.4 - 52.0 (%)    MCV 81.7  78.0 - 100.0 (fL)    MCH 27.3  26.0 - 34.0 (pg)    MCHC 33.4  30.0 - 36.0 (g/dL)    RDW 01.0  27.2 - 53.6 (%)    Platelets 267  150 - 400 (K/uL)   PROTIME-INR     Status: Abnormal   Collection Time   08/06/11  3:17 PM      Component Value Range Comment   Prothrombin Time 19.7 (*) 11.6 - 15.2 (seconds)    INR 1.64 (*) 0.00 - 1.49    APTT     Status: Normal   Collection Time   08/06/11   3:17 PM      Component Value Range Comment   aPTT 35  24 - 37 (seconds)   DIFFERENTIAL     Status: Normal   Collection Time   08/06/11  3:17 PM      Component Value Range Comment   Neutrophils Relative 65  43 - 77 (%)    Neutro Abs 6.7  1.7 - 7.7 (K/uL)    Lymphocytes Relative 25  12 - 46 (%)    Lymphs Abs 2.5  0.7 - 4.0 (K/uL)    Monocytes Relative 6  3 - 12 (%)    Monocytes Absolute 0.6  0.1 - 1.0 (K/uL)    Eosinophils Relative 4  0 - 5 (%)    Eosinophils Absolute 0.4  0.0 - 0.7 (K/uL)    Basophils Relative 1  0 - 1 (%)    Basophils Absolute 0.1  0.0 - 0.1 (K/uL)   BASIC METABOLIC PANEL     Status: Normal  Collection Time   08/06/11  3:17 PM      Component Value Range Comment   Sodium 142  135 - 145 (mEq/L)    Potassium 4.6  3.5 - 5.1 (mEq/L)    Chloride 107  96 - 112 (mEq/L)    CO2 27  19 - 32 (mEq/L)    Glucose, Bld 88  70 - 99 (mg/dL)    BUN 13  6 - 23 (mg/dL)    Creatinine, Ser 1.61  0.50 - 1.35 (mg/dL)    Calcium 8.5  8.4 - 10.5 (mg/dL)    GFR calc non Af Amer >90  >90 (mL/min)    GFR calc Af Amer >90  >90 (mL/min)   D-DIMER, QUANTITATIVE     Status: Normal   Collection Time   08/06/11  3:17 PM      Component Value Range Comment   D-Dimer, Quant 0.36  0.00 - 0.48 (ug/mL-FEU)   URINE RAPID DRUG SCREEN (HOSP PERFORMED)     Status: Abnormal   Collection Time   08/06/11  5:38 PM      Component Value Range Comment   Opiates POSITIVE (*) NONE DETECTED     Cocaine NONE DETECTED  NONE DETECTED     Benzodiazepines NONE DETECTED  NONE DETECTED     Amphetamines NONE DETECTED  NONE DETECTED     Tetrahydrocannabinol NONE DETECTED  NONE DETECTED     Barbiturates NONE DETECTED  NONE DETECTED    URINALYSIS, ROUTINE W REFLEX MICROSCOPIC     Status: Normal   Collection Time   08/06/11  5:39 PM      Component Value Range Comment   Color, Urine YELLOW  YELLOW     Appearance CLEAR  CLEAR     Specific Gravity, Urine 1.011  1.005 - 1.030     pH 6.0  5.0 - 8.0     Glucose, UA  NEGATIVE  NEGATIVE (mg/dL)    Hgb urine dipstick NEGATIVE  NEGATIVE     Bilirubin Urine NEGATIVE  NEGATIVE     Ketones, ur NEGATIVE  NEGATIVE (mg/dL)    Protein, ur NEGATIVE  NEGATIVE (mg/dL)    Urobilinogen, UA 0.2  0.0 - 1.0 (mg/dL)    Nitrite NEGATIVE  NEGATIVE     Leukocytes, UA NEGATIVE  NEGATIVE  MICROSCOPIC NOT DONE ON URINES WITH NEGATIVE PROTEIN, BLOOD, LEUKOCYTES, NITRITE, OR GLUCOSE <1000 mg/dL.  CBC     Status: Normal   Collection Time   08/06/11  5:58 PM      Component Value Range Comment   WBC 8.3  4.0 - 10.5 (K/uL)    RBC 4.77  4.22 - 5.81 (MIL/uL)    Hemoglobin 13.0  13.0 - 17.0 (g/dL)    HCT 09.6  04.5 - 40.9 (%)    MCV 82.4  78.0 - 100.0 (fL)    MCH 27.3  26.0 - 34.0 (pg)    MCHC 33.1  30.0 - 36.0 (g/dL)    RDW 81.1  91.4 - 78.2 (%)    Platelets 249  150 - 400 (K/uL)   COMPREHENSIVE METABOLIC PANEL     Status: Abnormal   Collection Time   08/06/11  5:58 PM      Component Value Range Comment   Sodium 142  135 - 145 (mEq/L)    Potassium 4.2  3.5 - 5.1 (mEq/L)    Chloride 106  96 - 112 (mEq/L)    CO2 29  19 - 32 (mEq/L)    Glucose,  Bld 82  70 - 99 (mg/dL)    BUN 12  6 - 23 (mg/dL)    Creatinine, Ser 9.60  0.50 - 1.35 (mg/dL)    Calcium 8.6  8.4 - 10.5 (mg/dL)    Total Protein 6.7  6.0 - 8.3 (g/dL)    Albumin 3.6  3.5 - 5.2 (g/dL)    AST 17  0 - 37 (U/L)    ALT 32  0 - 53 (U/L)    Alkaline Phosphatase 82  39 - 117 (U/L)    Total Bilirubin 0.2 (*) 0.3 - 1.2 (mg/dL)    GFR calc non Af Amer >90  >90 (mL/min)    GFR calc Af Amer >90  >90 (mL/min)   POCT I-STAT TROPONIN I     Status: Normal   Collection Time   08/06/11  6:16 PM      Component Value Range Comment   Troponin i, poc 0.00  0.00 - 0.08 (ng/mL)    Comment 3            PROTIME-INR     Status: Abnormal   Collection Time   08/07/11  5:58 AM      Component Value Range Comment   Prothrombin Time 21.8 (*) 11.6 - 15.2 (seconds)    INR 1.86 (*) 0.00 - 1.49      Assessment/Plan:   Active Problems: 1  TOBACCO ABUSE 2 CHEST PAIN 3 CAD (coronary artery disease): SP STEMI last admission.  Distal Circumflex disease. 4 Acute thrombus of left ventricle   Plan:  Subtherapeutic INR.  Lovenox Per Pharmacy until INR >2.0.  Will D/C topical nitrates and resume Imdur 15 mg as BP allows.  Prior to last discharge, pt was taken off beta-blocker and ACE-I due to low BP; resume if BP will allow. /TK   Length of Stay:  LOS: 1 day    HAGER,BRYAN W PA-C 08/07/2011, 3:35 PM  Patient seen and examined. Agree with assessment and plan. Artis Buechele A 08/07/2011 3:56 PM

## 2011-08-07 NOTE — Progress Notes (Signed)
ANTICOAGULATION CONSULT NOTE - Initial Consult  Pharmacy Consult for Lovenox Indication: history of LV thrombus, DVT  No Known Allergies  Patient Measurements: Height: 5\' 10"  (177.8 cm) Weight: 183 lb 10.3 oz (83.3 kg) IBW/kg (Calculated) : 73    Vital Signs: Temp: 97.1 F (36.2 C) (11/06 1300) Temp src: Oral (11/06 1300) BP: 97/65 mmHg (11/06 1300) Pulse Rate: 81  (11/06 1300)  Labs:  Basename 08/07/11 0558 08/06/11 1758 08/06/11 1517 08/06/11 1516  HGB -- 13.0 12.4* --  HCT -- 39.3 37.1* --  PLT -- 249 267 --  APTT -- -- 35 --  LABPROT 21.8* -- 19.7* --  INR 1.86* -- 1.64* --  HEPARINUNFRC -- -- -- --  CREATININE -- 0.85 0.91 --  CKTOTAL -- -- -- 85  CKMB -- -- -- 3.3  TROPONINI -- -- -- <0.30   Estimated Creatinine Clearance: 115.7 ml/min (by C-G formula based on Cr of 0.85).  Medical History: Past Medical History  Diagnosis Date  . Asthma   . Myocardial infarct, old     pt D/C  from [facility] 4days ago  . DVT of lower extremity, bilateral     PT reports a HX of  Bil DVT . PT takes warfarin  andeffient    Medications:  Scheduled:    . aspirin EC  81 mg Oral Daily  . aspirin  300 mg Rectal NOW  . isosorbide mononitrate  15 mg Oral Daily  . loratadine  10 mg Oral Daily  . LORazepam  0.5 mg Oral BID  . pantoprazole  40 mg Oral Q1200  . prasugrel  10 mg Oral Daily  . rosuvastatin  40 mg Oral Daily  . sodium chloride  3 mL Intravenous Q12H  . warfarin  7.5 mg Oral q1800  . DISCONTD: nitroGLYCERIN  0.5 inch Topical Q6H  . DISCONTD: nitroGLYCERIN  0.5 inch Topical QID    Assessment: 45yo M known to [place] for Coumadin for a history of LV thrombus and DVT who is now to start Lovenox per pharmacy secondary to a subtherapeutic INR. INR today is 1.86 (goal 2-3). Pt has received enoxaparin in the past and is familiar with the medication.   Goal of Therapy:  Anti-Xa 0.6-1.2 if needed   Plan:  Lovenox 90mg  SQ q12h-1st dose this pm. Will follow-up a CBC every 72 hours  and make adjustments as needed. Will follow-up MD plan for bridge of Lovenox-Coumadin.   Fayne Norrie 08/07/2011,4:07 PM

## 2011-08-08 ENCOUNTER — Other Ambulatory Visit: Payer: Self-pay

## 2011-08-08 LAB — PROTIME-INR
INR: 1.49 (ref 0.00–1.49)
Prothrombin Time: 18.3 seconds — ABNORMAL HIGH (ref 11.6–15.2)

## 2011-08-08 MED ORDER — WARFARIN SODIUM 7.5 MG PO TABS
7.5000 mg | ORAL_TABLET | ORAL | Status: DC
  Administered 2011-08-09: 7.5 mg via ORAL
  Filled 2011-08-08: qty 1

## 2011-08-08 MED ORDER — LORAZEPAM 1 MG PO TABS
1.0000 mg | ORAL_TABLET | Freq: Two times a day (BID) | ORAL | Status: DC
  Administered 2011-08-08 – 2011-08-10 (×4): 1 mg via ORAL
  Filled 2011-08-08 (×4): qty 1

## 2011-08-08 MED ORDER — FLUOXETINE HCL 20 MG PO CAPS
20.0000 mg | ORAL_CAPSULE | Freq: Every day | ORAL | Status: DC
  Administered 2011-08-08 – 2011-08-10 (×3): 20 mg via ORAL
  Filled 2011-08-08 (×4): qty 1

## 2011-08-08 MED ORDER — WARFARIN SODIUM 10 MG PO TABS
10.0000 mg | ORAL_TABLET | ORAL | Status: DC
  Administered 2011-08-08: 10 mg via ORAL
  Filled 2011-08-08 (×2): qty 1

## 2011-08-08 MED ORDER — WARFARIN SODIUM 5 MG PO TABS: 5.0000 mg | ORAL_TABLET | Freq: Once | ORAL | Status: DC

## 2011-08-08 NOTE — Progress Notes (Signed)
Pt. Seen and examined. Agree with the NP/PA-C note as written. Very anxious. Non-cardiac chest pain. Enzymes negative. No clear role for lexiscan with recent negative cath. Will start Prozac 20 mg for anxiety. Increase lorazepam to 1 mg BID. Plan d/c home tomorrow on increased dose coumadin.

## 2011-08-08 NOTE — Progress Notes (Signed)
[facility] DAILY PROGRESS NOTE  [name]   [id number] 1966-01-19     Subjective:  High Anxiety!  Objective:  Temp:  [97.1 F (36.2 C)-97.9 F (36.6 C)] 97.7 F (36.5 C) (11/07 0513) Pulse Rate:  [67-81] 67  (11/07 0513) Resp:  [14-18] 14  (11/07 0513) BP: (97-108)/(65-66) 100/66 mmHg (11/07 0513) SpO2:  [89 %-98 %] 98 % (11/07 0513) Weight:  [83.3 kg (183 lb 10.3 oz)-85.3 kg (188 lb 0.8 oz)] 188 lb 0.8 oz (85.3 kg) (11/07 0513) Weight change:   Intake/Output from previous day: 11/06 0701 - 11/07 0700 In: 1840 [P.O.:1840] Out: 1200 [Urine:1200] Intake/Output from this shift: Total I/O In: 680 [P.O.:680] Out: 1100 [Urine:1100]  Physical Exam: General appearance: alert, cooperative and no distress Lungs: clear to auscultation bilaterally Heart: regular rate and rhythm, S1, S2 normal, no murmur, click, rub or gallop Extremities: no LEE Pulses: 2+ radials  Lab Results:  Results for [name], [name] (MRN [id number]) as of 08/08/2011 11:21  Ref. Range 08/06/2011 17:58 08/06/2011 18:16 08/07/2011 05:58 08/08/2011 05:05 08/08/2011 05:52  Sodium Latest Range: 135-145 mEq/L 142      Potassium Latest Range: 3.5-5.1 mEq/L 4.2      Chloride Latest Range: 96-112 mEq/L 106      CO2 Latest Range: 19-32 mEq/L 29      BUN Latest Range: 6-23 mg/dL 12      Creat Latest Range: 0.50-1.35 mg/dL 7.82      Calcium Latest Range: 8.4-10.5 mg/dL 8.6      GFR calc non Af Amer Latest Range: >90 mL/min >90      GFR calc Af Amer Latest Range: >90 mL/min >90      Glucose Latest Range: 70-99 mg/dL 82      Alkaline Phosphatase Latest Range: 39-117 U/L 82      Albumin Latest Range: 3.5-5.2 g/dL 3.6      AST Latest Range: 0-37 U/L 17      ALT Latest Range: 0-53 U/L 32      Total Protein Latest Range: 6.0-8.3 g/dL 6.7      Total Bilirubin Latest Range: 0.3-1.2 mg/dL 0.2 (L)      WBC Latest Range: 4.0-10.5 K/uL 8.3      RBC Latest Range: 4.22-5.81 MIL/uL 4.77        HGB Latest Range: 13.0-17.0 g/dL 95.6      HCT Latest Range: 39.0-52.0 % 39.3      MCV Latest Range: 78.0-100.0 fL 82.4      MCH Latest Range: 26.0-34.0 pg 27.3      MCHC Latest Range: 30.0-36.0 g/dL 21.3      RDW Latest Range: 11.5-15.5 % 13.2      Platelets Latest Range: 150-400 K/uL 249      Prothrombin Time Latest Range: 11.6-15.2 seconds   21.8 (H) 18.3 (H)   INR Latest Range: 0.00-1.49    1.86 (H) 1.49   Troponin i, poc Latest Range: 0.00-0.08 ng/mL  0.00       Assessment/Plan:   Principal Problem:  *CHEST PAIN Active Problems:  TOBACCO ABUSE  CAD (coronary artery disease)  Acute thrombus of left ventricle   Plan:  Cardiac enzymes negative x2.  INR continues to drop.  Increased coumadin to 10mg  M,W,F;  7.5mg  T, TH, S,S.   Patient did not take coumadin Sunday.  INR now 1.49.  Getting Lovenox.  Patient has high anxiety level regarding CAD.  Question whether we should do Lexiscan as inpatient instead of  OP.  Added Imdur 15mg  daily yesterday.  BP, HR well controlled but no room for ACE-I or BB.  Time Spent Directly with Patient:   Length of Stay:  LOS: 2 days    Mirinda Monte W PA-C 08/08/2011, 11:22 AM

## 2011-08-08 NOTE — Progress Notes (Signed)
Pt had c/o of chest pressure around 2130 was given 2 percocet Pt had no relief. Pt sill had c/o chest pressure EKG done Normal sinus rhythm with sinus arrythmia. Pt  Has nito-paste to rt arm. Will continue to monitor. Ilean Skill LPN

## 2011-08-09 DIAGNOSIS — F419 Anxiety disorder, unspecified: Secondary | ICD-10-CM | POA: Diagnosis present

## 2011-08-09 LAB — PROTIME-INR: INR: 1.82 — ABNORMAL HIGH (ref 0.00–1.49)

## 2011-08-09 NOTE — Progress Notes (Addendum)
Subjective: No chest pain, no SOB.  Stated he walked in hall with out chest pain.  Objective: Vital signs in last 24 hours: Temp:  [97.9 F (36.6 C)-98.4 F (36.9 C)] 98 F (36.7 C) (11/08 0552) Pulse Rate:  [63-77] 63  (11/08 0552) Resp:  [18-20] 20  (11/08 0552) BP: (95-127)/(58-85) 96/58 mmHg (11/08 0552) SpO2:  [96 %-97 %] 96 % (11/08 0552) Weight change:  Last BM Date: 08/08/11 Intake/Output from previous day: 11/07 0701 - 11/08 0700 In: 1160 [P.O.:1160] Out: 1100 [Urine:1100] Intake/Output this shift:    PE: GENERAL: affect pleasant but flat. HEART: S1S2, RRR, without M,G,R, or click. LUNGS:  Clear without rales, rhonchi, or wheezes. ABD: soft, non-tender, po. Bowel sounds. EXT: no edema.  Lab Results:  Basename 08/06/11 1758 08/06/11 1517  WBC 8.3 10.2  HGB 13.0 12.4*  HCT 39.3 37.1*  PLT 249 267   BMET  Basename 08/06/11 1758 08/06/11 1517  NA 142 142  K 4.2 4.6  CL 106 107  CO2 29 27  GLUCOSE 82 88  BUN 12 13  CREATININE 0.85 0.91  CALCIUM 8.6 8.5    Basename 08/06/11 1516  TROPONINI <0.30    Lab Results  Component Value Date   CHOL 122 07/24/2011   HDL 38* 07/24/2011   LDLCALC 56 07/24/2011   TRIG 140 07/24/2011   CHOLHDL 3.2 07/24/2011   Lab Results  Component Value Date   HGBA1C 5.6 07/23/2011     Lab Results  Component Value Date   TSH 1.252 04/08/2009    Hepatic Function Panel  Basename 08/06/11 1758  PROT 6.7  ALBUMIN 3.6  AST 17  ALT 32  ALKPHOS 82  BILITOT 0.2*  BILIDIR --  IBILI --   No results found for this basename: CHOL in the last 72 hours No results found for this basename: PROTIME in the last 72 hours    EKG: Orders placed during the hospital encounter of 08/06/11  . EKG 12-LEAD  . EKG 12-LEAD  . EKG 12-LEAD    Studies/Results: No results found.  Medications: I have reviewed the patient's current medications.  Assessment/Plan: Patient Active Problem List  Diagnoses  . TOBACCO ABUSE  . POST  TRAUMATIC STRESS SYNDROME  . PVD WITH CLAUDICATION  . GERD  . HIATAL HERNIA  . CONSTIPATION  . CHEST PAIN  . CAD (coronary artery disease)  . Acute thrombus of left ventricle   PLAN:  INR not therapeutic, on Lovenox crossover.  No further chest pain.  Recent STEMI treated medically due to small vessels.  Also hypotension, could not d/c on betablocker because of this or   Ace.  Continues to be hypotensive without symptoms.  Does have anxiety requests outpt. meds to help control.       LOS: 3 days   INGOLD,LAURA R 08/09/2011, 11:20 AM  Agree with note written by Nada Boozer RNP  No CP. STEMI. Lovenox to coumadin A/C. INR sub thereputic. Home when INR > 2.0. Exam benign. Runell Gess 08/09/2011 3:31 PM

## 2011-08-10 LAB — CBC
MCHC: 33.6 g/dL (ref 30.0–36.0)
Platelets: 261 10*3/uL (ref 150–400)
RDW: 13.2 % (ref 11.5–15.5)
WBC: 8.1 10*3/uL (ref 4.0–10.5)

## 2011-08-10 LAB — PROTIME-INR: INR: 2.05 — ABNORMAL HIGH (ref 0.00–1.49)

## 2011-08-10 MED ORDER — ASPIRIN 81 MG PO TBEC: 81.0000 mg | DELAYED_RELEASE_TABLET | Freq: Every day | ORAL | Status: DC

## 2011-08-10 MED ORDER — NITROGLYCERIN 0.4 MG SL SUBL: 0.4000 mg | SUBLINGUAL_TABLET | SUBLINGUAL | Status: DC | PRN

## 2011-08-10 MED ORDER — ISOSORBIDE MONONITRATE 15 MG HALF TABLET: 15.0000 mg | ORAL_TABLET | Freq: Every day | ORAL | Status: DC

## 2011-08-10 MED ORDER — FLUOXETINE HCL 20 MG PO CAPS: 20.0000 mg | ORAL_CAPSULE | Freq: Every day | ORAL | Status: DC

## 2011-08-10 MED ORDER — LORAZEPAM 1 MG PO TABS: 1.0000 mg | ORAL_TABLET | Freq: Two times a day (BID) | ORAL | Status: AC

## 2011-08-10 NOTE — Discharge Summary (Signed)
Physician Discharge Summary  Patient ID: [name] MRN: [id number] DOB/AGE: 03-04-1966 45 y.o.  Admit date: 08/06/2011 Discharge date: 08/10/2011  Admission Diagnoses:  Discharge Diagnoses:  Principal Problem:  *CHEST PAIN Active Problems:  TOBACCO ABUSE  CAD (coronary artery disease)  Acute thrombus of left ventricle  Anxiety   Discharged Condition: stable  Hospital Course:  Patient is a 45 year old Caucasian male was just discharged 07/31/2011 tradition for ST elevation myocardial infarction presumed secondary to circumflex spasm. Patient has some coronary disease in the distal circumflex.  He also has a left ventricular thrombus and was put on Lovenox to Coumadin prior to discharge. Patient states that he did not take his Coumadin on Sunday prior to admission. He developed substernal chest pressure arrest. He took 2 sublingual nitroglycerin at home without relief. Patient was admitted cardiac enzymes were cycled. INR was subtherapeutic. The patient was continued on Coumadin. His chest pain was not thought to be related to anxiety. He was started on Ativan and Prozac.currently patient's INR is therapeutic at 2.05. He was discharged him stable condition with followup INR on Monday. Follow up with the cardiologist in the office.   Significant Diagnostic Studies:   Treatments:   Discharge Exam: Blood pressure 90/52, pulse 82, temperature 97.6 F (36.4 C), temperature source Oral, resp. rate 20, height 5\' 10"  (1.778 m), weight 85.3 kg (188 lb 0.8 oz), SpO2 98.00%.  See Rounding exam [id number].  Disposition: Home or Self Care  Discharge Orders    Future Orders Please Complete By Expires   Diet - low sodium heart healthy      Increase activity slowly        Current Discharge Medication List    START taking these medications   Details  aspirin EC 81 MG EC tablet Take 1 tablet (81 mg total) by mouth daily. Qty: 30 tablet, Refills: 0    FLUoxetine (PROZAC) 20 MG capsule  Take 1 capsule (20 mg total) by mouth daily. Qty: 30 capsule, Refills: 1    isosorbide mononitrate (IMDUR) 15 mg TB24 Take 0.5 tablets (15 mg total) by mouth daily. Qty: 30 tablet, Refills: 3    LORazepam (ATIVAN) 1 MG tablet Take 1 tablet (1 mg total) by mouth 2 (two) times daily. Qty: 30 tablet, Refills: 1    nitroGLYCERIN (NITROSTAT) 0.4 MG SL tablet Place 1 tablet (0.4 mg total) under the tongue every 5 (five) minutes as needed for chest pain (DO NOT TAKE MORE THAN THREE(3) TABLETS.). Qty: 25 tablet, Refills: 2      CONTINUE these medications which have NOT CHANGED   Details  methocarbamol (ROBAXIN) 750 MG tablet Take 750 mg by mouth 4 (four) times daily. 2 tabs PO 4 times a day    nicotine (NICODERM CQ - DOSED IN MG/24 HOURS) 21 mg/24hr patch Place 1 patch onto the skin daily.     omeprazole (PRILOSEC) 20 MG capsule Take 20 mg by mouth daily.      oxyCODONE-acetaminophen (PERCOCET) 5-325 MG per tablet Take 1-2 tablets by mouth every 6 (six) hours as needed. For pain    prasugrel (EFFIENT) 10 MG TABS Take 10 mg by mouth daily.     rosuvastatin (CRESTOR) 40 MG tablet Take 40 mg by mouth daily.     warfarin (COUMADIN) 5 MG tablet Take 7.5-10 mg by mouth daily. Take 2 tablets on Monday, Wednesday, Friday and then take 1.5 tablets all other days      STOP taking these medications     Omeprazole  Magnesium 20.6 (20 BASE) MG CPDR          Signed: HAGER,BRYAN W 08/10/2011, 1:39 PM

## 2011-08-10 NOTE — Consult Note (Signed)
Tobacco cessation- patient on his way out the door when I came to see him. He was in a big hurry. Said he's using the patches but would not elaborate any further. Bolted out of the door. I did see the pt on his last admission when he was here at [facility] and did extensive education with him.

## 2011-08-10 NOTE — Progress Notes (Signed)
[facility]  Subjective: No CP or SOB.  Feels better since Prozac added.  Complaining of lite-headness all of a sudden and double vision in left eye.  Objective: Vital signs in last 24 hours: Temp:  [97.6 F (36.4 C)-97.9 F (36.6 C)] 97.6 F (36.4 C) (11/09 0520) Pulse Rate:  [63-75] 75  (11/09 0520) Resp:  [19-20] 20  (11/09 0520) BP: (89-109)/(57-70) 90/61 mmHg (11/09 0520) SpO2:  [97 %-99 %] 98 % (11/09 0520) Last BM Date: 08/08/11  Intake/Output from previous day: 11/08 0701 - 11/09 0700 In: 480 [P.O.:480] Out: 651 [Urine:650; Stool:1] Intake/Output this shift:    Medications Current Facility-Administered Medications  Medication Dose Route Frequency Provider Last Rate Last Dose  . 0.9 %  sodium chloride infusion  20 mL Intravenous Continuous Grant [place], [place] 20 mL/hr at 08/06/11 2008 20 mL at 08/06/11 2008  . 0.9 %  sodium chloride infusion  250 mL Intravenous Continuous Eda Paschal [place], [place] 1 mL/hr at 08/06/11 2009 250 mL at 08/06/11 2009  . acetaminophen (TYLENOL) tablet 650 mg  650 mg Oral Q4H PRN Abelino Derrick, PA      . alum & mag hydroxide-simeth (MAALOX/MYLANTA) 200-200-20 MG/5ML suspension 15-30 mL  15-30 mL Oral Q2H PRN Abelino Derrick, PA      . aspirin EC tablet 81 mg  81 mg Oral Daily Eda Paschal [place], [place]   81 mg at 08/10/11 0947  . enoxaparin (LOVENOX) injection 90 mg  90 mg Subcutaneous Q12H Gala Lewandowsky [place], PHARMD   100 mg at 08/10/11 0547  . FLUoxetine (PROZAC) capsule 20 mg  20 mg Oral Daily Lisette Abu. Hilty   20 mg at 08/10/11 0947  . guaiFENesin-dextromethorphan (ROBITUSSIN DM) 100-10 MG/5ML syrup 15 mL  15 mL Oral Q4H PRN Abelino Derrick, PA      . isosorbide mononitrate (IMDUR) 24 hr tablet 15 mg  15 mg Oral Daily Dwana Melena, PA   15 mg at 08/10/11 0947  . loratadine (CLARITIN) tablet 10 mg  10 mg Oral Daily Eda Paschal [place], [place]   10 mg at 08/10/11 0948  . LORazepam (ATIVAN) tablet 1 mg  1 mg Oral BID Lisette Abu [place]   1  mg at 08/10/11 0948  . nitroGLYCERIN (NITROSTAT) SL tablet 0.4 mg  0.4 mg Sublingual Q5 min PRN Abelino Derrick, PA      . ondansetron [facility]) injection 4 mg  4 mg Intravenous Q6H PRN Abelino Derrick, PA      . oxyCODONE-acetaminophen (PERCOCET) 5-325 MG per tablet 1-2 tablet  1-2 tablet Oral Q6H PRN Abelino Derrick, PA   2 tablet at 08/08/11 0346  . pantoprazole (PROTONIX) EC tablet 40 mg  40 mg Oral Q1200 Abelino Derrick, PA   40 mg at 08/08/11 1241  . pramoxine-mineral oil-zinc (TUCK'S) rectal ointment 1 application  1 application Rectal TID PRN Abelino Derrick, PA      . prasugrel (EFFIENT) tablet 10 mg  10 mg Oral Daily Eda Paschal [place], [place]   10 mg at 08/10/11 0949  . rosuvastatin (CRESTOR) tablet 40 mg  40 mg Oral Daily Eda Paschal [place], [place]   40 mg at 08/10/11 0949  . sodium chloride 0.9 % injection 3 mL  3 mL Intravenous Q12H Eda Paschal [place], PA   3 mL at 08/10/11 1000  . sodium chloride 0.9 % injection 3 mL  3 mL Intravenous PRN Abelino Derrick, PA      .  warfarin (COUMADIN) tablet 10 mg  10 mg Oral [place] Dwana Melena, PA   10 mg at 08/08/11 1741  . warfarin (COUMADIN) tablet 7.5 mg  7.5 mg Oral Custom Dwana Melena, PA   7.5 mg at 08/09/11 1739  . zolpidem (AMBIEN) tablet 10 mg  10 mg Oral QHS PRN Abelino Derrick, PA        PE: General appearance: alert, cooperative and no distress Lungs: clear to auscultation bilaterally Heart: regular rate and rhythm, S1, S2 normal, no murmur, click, rub or gallop Pulses: 2+ and symmetric  Lab Results:   Basename 08/10/11 0510  WBC 8.1  HGB 13.2  HCT 39.3  PLT 261   BMET No results found for this basename: NA:3,K:3,CL:3,CO2:3,GLUCOSE:3,BUN:3,CREATININE:3,CALCIUM:3 in the last 72 hours PT/INR  Basename 08/09/11 0510 08/08/11 0505  LABPROT 21.4* 18.3*  INR 1.82* 1.49    Studies/Results:    Assessment/Plan  Principal Problem:  *CHEST PAIN Active Problems:  TOBACCO ABUSE  CAD (coronary artery disease)  Acute thrombus of left ventricle   Anxiety  Plan:  Awaiting  Stat PT/INR.  Will check orthostatic BP.  BP soft.  Plan was to DC when INR >2.0.  Left eye double vision is not acute. F/U OP.       LOS: 4 days    HAGER,BRYAN W PA-C 08/10/2011 9:58 AM  Orthostatic Vitals ok.  INR 2.05 HAGER,BRYAN W PA-C 08/10/2011 1:06 PM  Feeling better. INR therapeutic Reeducated regarding warfarin risks benefits monitoring and compliance.DC home.

## 2011-08-10 NOTE — Progress Notes (Signed)
Utilization review completed. Jaryan Chicoine, RN, BSN. 08/10/11 

## 2011-09-29 NOTE — Discharge Summary (Signed)
[name], [name] NO.:  [id number]  MEDICAL RECORD NO.:  [id number]  LOCATION:  2012                         FACILITY:  [facility]  PHYSICIAN:  Nicki Guadalajara, M.D.     DATE OF BIRTH:  1966/09/09  DATE OF ADMISSION:  07/23/2011 DATE OF DISCHARGE:  07/31/2011                              DISCHARGE SUMMARY   DISCHARGE DIAGNOSES: 1. ST-elevation myocardial infarction of the inferior wall, secondary     to #2 and vasospasms, discharged on day #8. 2. Coronary disease with distal disease with abrupt distal occlusion     of 3 distal branches of the circumflex vessel, which were very     small in caliber and not amenable to intervention.     a.     EF 55%-60%. 3. Left ventricular thrombus. 4. Severe peripheral arterial disease with history of fem-pop bypass     bilaterally. 5. Hypertension. 6. Tobacco use. 7. Substance abuse. 8. History of peripheral neuropathy. 9. Back pain with stable thoracic MRI this admission. 10.Anticoagulation for left ventricular thrombus.     a.     History of deep vein thrombosis in the past, had stopped      Coumadin while incarcerated. 11.Hypertension with beta-blocker and ACE inhibitor.     a.     We will hold beta-blocker and ACE inhibitor, so that would      be best treatment post myocardial infarction, but with hypotension      we will hold and monitor blood pressure as an outpatient and      resume as blood pressure allows in the outpatient setting. 12.Recent admission for alcohol-induced pancreatitis.  He was     discharged on July 02, 2011.  DISCHARGE CONDITION:  Improved.  PROCEDURES:  Emergent combined left heart cath, July 23, 2011 by Dr. Nicki Guadalajara.  DISCHARGE MEDICATIONS:  See medication reconciliation sheet, but he will have sublingual nitroglycerin, baby aspirin, Effient, as well as Coumadin.  Please note, we also added Percocet for significant back pain as well as Crestor for dyslipidemia.  DISCHARGE  INSTRUCTIONS: 1. No work until cleared by Dr. Tresa Endo. 2. Increase activity slowly.  May shower.  No lifting for 2 weeks.  No     driving for 1 week. 3. Low-sodium heart healthy diet. 4. Wash cath site with soap and water.  Call if any bleeding,     swelling, or drainage. 5. Stop smoking. 6. Follow up with Dr. Tresa Endo at [facility].  Our     office will call with date and time. 7. Follow up with [facility] for Coumadin Clinic     on August 02, 2011 at 3:10 p.m.  They will also review medications     with you. 8. Our office will schedule an appointment with Neurosurgery for your     back pain.  They will also call you for that appointment.  HOSPITAL COURSE:  A 45 year old white male with history of peripheral vascular disease, including a right fem to posterior tibial bypass, as well as prior thrombectomy and vein patch angioplasty of his left popliteal artery.  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  Other history includes tobacco use, polysubstance abuse.  He presented to the emergency room with chest pain on July 23, 2011.  He was at his computer when he developed severe retching, squeezing chest pain associated with diaphoresis, nausea, and dizziness.  EMS arrived, he was found to have ST-elevation MI STEMI.  Code was called and he was transported emergently to [facility] cath lab and ST elevation of 2 mm elevation in leads II, III, and aVF, and underwent cardiac catheterizations emergently.  The right coronary initially seem to suggest spasm in the mid region just above the acute marginal, and with IV nitro and additional injections the spasms improved.  He also was found to have abrupt distal occlusion of 3 distal branches of the circumflex artery, which were small in caliber and not amenable to intervention.  EF during the cardiac cath was 50%-55% with focal  distal inferior as well as inferior apical hypocontractility.  He was started on IV nitro and IIb/IIIa with Integrilin and given Effient.  Also discussions would be set up for smoking cessation and decreasing caffeine intake.  Also, history of cocaine use in the past, which he states he has stopped.  He does frequently drink caffeine.  The patient also has somewhat of anxiety disorder.  other past history includes cholecystectomy and recent admission June 30, 2011 for alcohol- induced pancreatitis.  The patient after his cardiac cath went to this 2900 unit for further monitoring.  Nitroglycerin and Integrilin were continued.  Cardiac enzymes were positive, troponin peaked at greater than 25 and peak CK/MB, CK was 1653, MB 111.  The patient continued with chest discomfort.  He would be sound asleep, and if woken, he would state he still had chest pain.  Medications have been adjusted.  2D echo was done, which revealed LV thrombus.  His IV heparin was restarted.  He started ambulation by October 24 with cardiac rehab.  Blood pressure was low with ambulation, down to 86/62, and the medications were continued.  The patient slowly improved, and then developed mid back pain, worse with inspiration.  MRI of his back was done with no acute changes.  Does have a history of neuropathy, but states this was that type of pain.  The patient's Coumadin had been restarted.  He is on Lovenox/Coumadin crossover, and by July 31, 2011, his INR was therapeutic at 2.57.  Dr. Rennis Golden saw him and felt he was stable for discharge, though we will follow him closely as an outpatient.  The patient needs close monitoring to resume his beta-blocker.  Here in the hospital, he was on 3.125 of Coreg twice a day with blood pressures in the 80s and the patient was symptomatic with dizziness, lightheadedness, and nausea.  We have discontinued both the Coreg and ACE inhibitor, and we will resume as an outpatient  presumably with Coreg first to see how he tolerates.  He is also being discharged with Coumadin, Effient, and aspirin.  The patient will follow up as instructed.  PHYSICAL EXAMINATION:  VITAL SIGNS:  On discharge, his blood pressure was that morning 85/63, and then 108/60, pulse 57, respirations 19, temp 98.7, oxygen saturation on room air 98%. HEART:  Regular rate and rhythm.  S1, S2. LUNGS:  Clear. ABDOMEN:  Positive bowel sounds. EXTREMITIES:  No edema.  The patient denied any chest pain at all.  LABORATORY VALUES:  Hemoglobin at discharge 13.8, hematocrit 42.6, WBC 7.5, and platelets 248.  These remained stable  throughout hospitalization.  Pro-time on admission 16.  INR was 1.25.  The patient was resumed on his Coumadin, and prior to discharge, pro-time 28 and INR was 2.57.  Chemistry at discharge:  Sodium 139, potassium 4.2, chloride 103, CO2 28, nonfasting glucose was 119, BUN 8, creatinine 0.80.  Calcium was 8.7.  On admission, amylase was 40, hemoglobin A1c was 5.6, and lipase was 29.  Cardiac markers:  CK ranged 14, 16, 53, and 723.  MBs, initially 22.8, peaked at 111.4, and last was 38.4.  Troponin I initially 10.65, has been greater than 25, and followup was 21.65.  Total cholesterol is 122, triglycerides 140, HDL 38, and LDL 56.  Homocystine was 10.1.  Drug screen was positive for benzodiazepines as well as opiates.  MRSA screen was negative.  ANA was negative.  Cardiolipin antibody IgG was 9 and cardiolipin antibody IgM was 1, go slow.  2D echo was done on July 24, 2011, EF 55%-60% with mild hypokinesis of the distal anterior septal myocardium.  There was a thrombus medium size 18 mm x 10 mm, pedunculated, highly mobile.  RADIOLOGY:  Chest x-ray, no evidence of acute cardiopulmonary disease and that was June 30, 2011.  MRI of the spine was on October 29, no specific abnormality in the thoracic spine is identified to account the patient's back  pain.  At discharge, he was seen by Dr. Rennis Golden and felt stable for discharge, except to stop the beta-blocker and the ACE given more blood pressure. The patient is ambulated with cardiac rehab without complications.  He has undergone education through them as well.     [name]. Annie Paras, N.P.   ______________________________ Nicki Guadalajara, M.D.    LRI/MEDQ  D:  07/31/2011  T:  07/31/2011  Job:  [id number]  cc:   [facility] Larina Earthly, M.D.  Electronically Signed by Nada Boozer N.P. on 08/01/2011 05:21:03 PM Electronically Signed by Nicki Guadalajara M.D. on 09/29/2011 02:17:37 PM

## 2011-10-02 ENCOUNTER — Emergency Department (HOSPITAL_COMMUNITY)
Admission: EM | Admit: 2011-10-02 | Discharge: 2011-10-02 | Disposition: A | Discharge status: Home / Self Care | Payer: Medicare Other | Attending: Emergency Medicine | Admitting: Emergency Medicine

## 2011-10-02 ENCOUNTER — Other Ambulatory Visit: Payer: Self-pay

## 2011-10-02 ENCOUNTER — Encounter (HOSPITAL_COMMUNITY): Payer: Self-pay | Admitting: Emergency Medicine

## 2011-10-02 ENCOUNTER — Emergency Department (HOSPITAL_COMMUNITY): Payer: Medicare Other

## 2011-10-02 DIAGNOSIS — Z86718 Personal history of other venous thrombosis and embolism: Secondary | ICD-10-CM | POA: Insufficient documentation

## 2011-10-02 DIAGNOSIS — J45909 Unspecified asthma, uncomplicated: Secondary | ICD-10-CM | POA: Insufficient documentation

## 2011-10-02 DIAGNOSIS — I252 Old myocardial infarction: Secondary | ICD-10-CM | POA: Insufficient documentation

## 2011-10-02 DIAGNOSIS — F419 Anxiety disorder, unspecified: Secondary | ICD-10-CM

## 2011-10-02 DIAGNOSIS — Z79899 Other long term (current) drug therapy: Secondary | ICD-10-CM | POA: Insufficient documentation

## 2011-10-02 DIAGNOSIS — Z7982 Long term (current) use of aspirin: Secondary | ICD-10-CM | POA: Insufficient documentation

## 2011-10-02 DIAGNOSIS — F411 Generalized anxiety disorder: Secondary | ICD-10-CM | POA: Insufficient documentation

## 2011-10-02 DIAGNOSIS — R079 Chest pain, unspecified: Secondary | ICD-10-CM | POA: Insufficient documentation

## 2011-10-02 DIAGNOSIS — R0602 Shortness of breath: Secondary | ICD-10-CM | POA: Insufficient documentation

## 2011-10-02 DIAGNOSIS — Z7901 Long term (current) use of anticoagulants: Secondary | ICD-10-CM | POA: Insufficient documentation

## 2011-10-02 LAB — CBC
MCHC: 33 g/dL (ref 30.0–36.0)
MCV: 81.6 fL (ref 78.0–100.0)
Platelets: 219 10*3/uL (ref 150–400)
RDW: 14.7 % (ref 11.5–15.5)
WBC: 11.4 10*3/uL — ABNORMAL HIGH (ref 4.0–10.5)

## 2011-10-02 LAB — COMPREHENSIVE METABOLIC PANEL
AST: 20 U/L (ref 0–37)
Albumin: 3.7 g/dL (ref 3.5–5.2)
Chloride: 104 mEq/L (ref 96–112)
Creatinine, Ser: 0.89 mg/dL (ref 0.50–1.35)
Potassium: 3.7 mEq/L (ref 3.5–5.1)
Total Bilirubin: 0.2 mg/dL — ABNORMAL LOW (ref 0.3–1.2)
Total Protein: 6.8 g/dL (ref 6.0–8.3)

## 2011-10-02 LAB — RAPID URINE DRUG SCREEN, HOSP PERFORMED
Barbiturates: NOT DETECTED
Benzodiazepines: NOT DETECTED
Cocaine: NOT DETECTED
Opiates: NOT DETECTED

## 2011-10-02 LAB — URINALYSIS, ROUTINE W REFLEX MICROSCOPIC
Bilirubin Urine: NEGATIVE
Leukocytes, UA: NEGATIVE
Nitrite: NEGATIVE
Specific Gravity, Urine: 1.015 (ref 1.005–1.030)
Urobilinogen, UA: 0.2 mg/dL (ref 0.0–1.0)
pH: 5.5 (ref 5.0–8.0)

## 2011-10-02 LAB — POCT I-STAT TROPONIN I: Troponin i, poc: 0.01 ng/mL (ref 0.00–0.08)

## 2011-10-02 MED ORDER — LORAZEPAM 1 MG PO TABS
1.0000 mg | ORAL_TABLET | Freq: Once | ORAL | Status: AC
  Administered 2011-10-02: 1 mg via ORAL
  Filled 2011-10-02: qty 1

## 2011-10-02 MED ORDER — LORAZEPAM 1 MG PO TABS: 1.0000 mg | ORAL_TABLET | Freq: Three times a day (TID) | ORAL | Status: AC | PRN

## 2011-10-02 NOTE — ED Notes (Signed)
Pt requesting something for a panic attack, he states "Im feeling one coming on and I used to take xanax but havent had one in a long time".  Dr Effie Shy made aware

## 2011-10-02 NOTE — ED Notes (Signed)
MD at bedside. 

## 2011-10-02 NOTE — ED Notes (Signed)
Pt co midsternal pressure since yesterday afternoon. Pt states took 2 of his own ntg w/o change. Pt has hx of anxiety. Pt c/o mild sob. No resp distress noted.

## 2011-10-02 NOTE — ED Provider Notes (Signed)
History     CSN: [id number]  Arrival date & time 10/02/11  0559   First MD Initiated Contact with Patient 10/02/11 [phone]      Chief Complaint  Patient presents with  . Chest Pain    started this afternoon. pt took own ntg tabs. pt felt nauseated after taking. pt took 4 baby asa  . Shortness of Breath    (Consider location/radiation/quality/duration/timing/severity/associated sxs/prior treatment) Patient is a 46 y.o. male presenting with chest pain and shortness of breath. The history is provided by the patient.  Chest Pain The chest pain began 12 - 24 hours ago. Chest pain occurs constantly. The chest pain is unchanged. Associated with: Stress. The severity of the pain is moderate. The quality of the pain is described as aching. The pain does not radiate. Exacerbated by: Nothing. Primary symptoms include shortness of breath.  Pertinent negatives for associated symptoms include no claudication, no diaphoresis, no lower extremity edema, no near-syncope, no numbness, no orthopnea and no weakness. He tried nitroglycerin and narcotics for the symptoms.  His past medical history is significant for MI.    Shortness of Breath  Associated symptoms include chest pain and shortness of breath. Pertinent negatives include no orthopnea.   he reports stress related to not seeing his children, living situation, and financial problems that might be contributing to his discomfort. He took aspirin this morning, and nitroglycerin, without improvement. Yesterday he took Vicodin that did not help either.  Past Medical History  Diagnosis Date  . Asthma   . Myocardial infarct, old     pt D/C  from [facility] 4days ago  . DVT of lower extremity, bilateral     PT reports a HX of  Bil DVT . PT takes warfarin  andeffient    Past Surgical History  Procedure Date  . Cholecystectomy   . Femoral bypass     info per pt    History reviewed. No pertinent family history.  History  Substance Use Topics  . Smoking  status: Current Everyday Smoker -- 1.0 packs/day    Types: Cigarettes  . Smokeless tobacco: Not on file  . Alcohol Use: No      Review of Systems  Constitutional: Negative for diaphoresis.  Respiratory: Positive for shortness of breath.   Cardiovascular: Positive for chest pain. Negative for orthopnea, claudication and near-syncope.  Neurological: Negative for weakness and numbness.  All other systems reviewed and are negative.    Allergies  Review of patient's allergies indicates no known allergies.  Home Medications   Current Outpatient Rx  Name Route Sig Dispense Refill  . ASPIRIN 81 MG PO TBEC Oral Take 1 tablet (81 mg total) by mouth daily. 30 tablet 0  . CARVEDILOL 3.125 MG PO TABS Oral Take 3.125 mg by mouth 2 (two) times daily with a meal.      . DIPHENHYDRAMINE HCL (SLEEP) 25 MG PO TABS Oral Take 25 mg by mouth every 6 (six) hours as needed. For anxiety     . FEXOFENADINE HCL 180 MG PO TABS Oral Take 180 mg by mouth daily.      Marland Kitchen FLUOXETINE HCL 20 MG PO CAPS Oral Take 1 capsule (20 mg total) by mouth daily. 30 capsule 1  . ISOSORBIDE MONONITRATE 15 MG HALF TABLET Oral Take 0.5 tablets (15 mg total) by mouth daily. 30 tablet 3  . METHOCARBAMOL 750 MG PO TABS Oral Take 1,500 mg by mouth 4 (four) times daily. 2 tabs PO 4 times a day    .  NITROGLYCERIN 0.4 MG SL SUBL Sublingual Place 1 tablet (0.4 mg total) under the tongue every 5 (five) minutes as needed for chest pain (DO NOT TAKE MORE THAN THREE(3) TABLETS.). 25 tablet 2  . OMEPRAZOLE 20 MG PO CPDR Oral Take 20 mg by mouth daily.      . OXYCODONE-ACETAMINOPHEN 5-325 MG PO TABS Oral Take 1-2 tablets by mouth every 6 (six) hours as needed. For pain    . ROSUVASTATIN CALCIUM 40 MG PO TABS Oral Take 40 mg by mouth daily.     . WARFARIN SODIUM 5 MG PO TABS Oral Take 7.5-10 mg by mouth daily. Take 2 tablets on Monday, Wednesday, Friday and then take 1.5 tablets all other days    . ZOLPIDEM TARTRATE 5 MG PO TABS Oral Take 5 mg  by mouth at bedtime as needed.      Marland Kitchen LORAZEPAM 1 MG PO TABS Oral Take 1 tablet (1 mg total) by mouth 3 (three) times daily as needed for anxiety. 15 tablet 0    BP 103/73  Pulse 74  Temp(Src) 98.1 F (36.7 C) (Oral)  Resp 20  SpO2 98%  Physical Exam  Nursing note and vitals reviewed. Constitutional: He is oriented to person, place, and time. He appears well-developed and well-nourished.  HENT:  Head: Normocephalic and atraumatic.  Right Ear: External ear normal.  Left Ear: External ear normal.  Eyes: Conjunctivae and EOM are normal. Pupils are equal, round, and reactive to light.  Neck: Normal range of motion and phonation normal. Neck supple.  Cardiovascular: Normal rate, regular rhythm, normal heart sounds and intact distal pulses.   Pulmonary/Chest: Effort normal and breath sounds normal. He exhibits no bony tenderness.  Abdominal: Soft. Normal appearance. There is no tenderness.  Musculoskeletal: Normal range of motion.  Neurological: He is alert and oriented to person, place, and time. He has normal strength. No cranial nerve deficit or sensory deficit. He exhibits normal muscle tone. Coordination normal.  Skin: Skin is warm, dry and intact.  Psychiatric: His behavior is normal. Judgment and thought content normal.       He is anxious    ED Course  Procedures (including critical care time)    Date: 10/02/2011  Rate: 92  Rhythm: normal sinus rhythm  QRS Axis: right  Intervals: normal  ST/T Wave abnormalities: inferior T wave inversion  Conduction Disutrbances:none  Narrative Interpretation:   Old EKG Reviewed: unchanged  11:20- reevaluation-the patient requested something for anxiety and was given Ativan. At this time. He states his midsternum is hurting to touch. It was tender to palpation.     Labs Reviewed  CBC - Abnormal; Notable for the following:    WBC 11.4 (*)    All other components within normal limits  COMPREHENSIVE METABOLIC PANEL - Abnormal;  Notable for the following:    Glucose, Bld 108 (*)    Total Bilirubin 0.2 (*)    All other components within normal limits  POCT I-STAT TROPONIN I  URINALYSIS, ROUTINE W REFLEX MICROSCOPIC  URINE RAPID DRUG SCREEN (HOSP PERFORMED)  I-STAT TROPONIN I   Dg Chest 2 View  10/02/2011  *RADIOLOGY REPORT*  Clinical Data:  Chest pain and shortness of breath.  History of hypertension.  CHEST - 2 VIEW  Comparison: 08/06/2011  Findings: The heart size and mediastinal contours are within normal limits.  Both lungs are clear.  The visualized skeletal structures are unremarkable.  IMPRESSION: No active disease.  Original Report Authenticated By: Reola Calkins, M.D.  1. Chest pain   2. Anxiety       MDM  Nonspecific chest pain, doubt ACS, PE, pneumonia. Anxiety is involved in his discomfort.   Plan prescription for Ativan #15 1 mg;  followup PCP when necessary        Flint Melter, MD 10/02/11 1127

## 2011-11-22 ENCOUNTER — Encounter (HOSPITAL_COMMUNITY): Payer: Self-pay | Admitting: *Deleted

## 2011-11-22 ENCOUNTER — Other Ambulatory Visit: Payer: Self-pay

## 2011-11-22 ENCOUNTER — Emergency Department (INDEPENDENT_AMBULATORY_CARE_PROVIDER_SITE_OTHER)
Admission: EM | Admit: 2011-11-22 | Discharge: 2011-11-22 | Disposition: A | Discharge status: Nursing Home (Includes ICF) | Payer: Medicare Other | Source: Home / Self Care | Attending: Emergency Medicine | Admitting: Emergency Medicine

## 2011-11-22 ENCOUNTER — Emergency Department (HOSPITAL_COMMUNITY): Payer: Medicare Other

## 2011-11-22 ENCOUNTER — Emergency Department (HOSPITAL_COMMUNITY)
Admission: EM | Admit: 2011-11-22 | Discharge: 2011-11-22 | Disposition: A | Discharge status: Home / Self Care | Payer: Medicare Other | Attending: Emergency Medicine | Admitting: Emergency Medicine

## 2011-11-22 DIAGNOSIS — Z91148 Patient's other noncompliance with medication regimen for other reason: Secondary | ICD-10-CM

## 2011-11-22 DIAGNOSIS — Z79899 Other long term (current) drug therapy: Secondary | ICD-10-CM | POA: Insufficient documentation

## 2011-11-22 DIAGNOSIS — F419 Anxiety disorder, unspecified: Secondary | ICD-10-CM

## 2011-11-22 DIAGNOSIS — Z9114 Patient's other noncompliance with medication regimen: Secondary | ICD-10-CM

## 2011-11-22 DIAGNOSIS — F411 Generalized anxiety disorder: Secondary | ICD-10-CM | POA: Insufficient documentation

## 2011-11-22 DIAGNOSIS — R079 Chest pain, unspecified: Secondary | ICD-10-CM | POA: Insufficient documentation

## 2011-11-22 DIAGNOSIS — R791 Abnormal coagulation profile: Secondary | ICD-10-CM

## 2011-11-22 DIAGNOSIS — E785 Hyperlipidemia, unspecified: Secondary | ICD-10-CM | POA: Insufficient documentation

## 2011-11-22 DIAGNOSIS — Z86718 Personal history of other venous thrombosis and embolism: Secondary | ICD-10-CM | POA: Insufficient documentation

## 2011-11-22 DIAGNOSIS — F17200 Nicotine dependence, unspecified, uncomplicated: Secondary | ICD-10-CM | POA: Insufficient documentation

## 2011-11-22 DIAGNOSIS — I252 Old myocardial infarction: Secondary | ICD-10-CM | POA: Insufficient documentation

## 2011-11-22 HISTORY — DX: Hyperlipidemia, unspecified: E78.5

## 2011-11-22 HISTORY — DX: Peripheral vascular disease, unspecified: I73.9

## 2011-11-22 HISTORY — DX: Anxiety disorder, unspecified: F41.9

## 2011-11-22 LAB — CBC
HCT: 44.2 % (ref 39.0–52.0)
Hemoglobin: 14.7 g/dL (ref 13.0–17.0)
MCH: 26.8 pg (ref 26.0–34.0)
MCHC: 33.3 g/dL (ref 30.0–36.0)

## 2011-11-22 LAB — POCT I-STAT, CHEM 8
BUN: 6 mg/dL (ref 6–23)
Calcium, Ion: 1.2 mmol/L (ref 1.12–1.32)
Chloride: 107 mEq/L (ref 96–112)
Glucose, Bld: 80 mg/dL (ref 70–99)
Potassium: 4.5 mEq/L (ref 3.5–5.1)

## 2011-11-22 LAB — POCT I-STAT TROPONIN I: Troponin i, poc: 0 ng/mL (ref 0.00–0.08)

## 2011-11-22 MED ORDER — ASPIRIN 81 MG PO CHEW
324.0000 mg | CHEWABLE_TABLET | Freq: Once | ORAL | Status: AC
  Administered 2011-11-22: 324 mg via ORAL
  Filled 2011-11-22: qty 3

## 2011-11-22 MED ORDER — LORAZEPAM 1 MG PO TABS: 1.0000 mg | ORAL_TABLET | Freq: Three times a day (TID) | ORAL | Status: AC | PRN

## 2011-11-22 MED ORDER — ISOSORBIDE MONONITRATE 15 MG HALF TABLET: 15.0000 mg | ORAL_TABLET | Freq: Every day | ORAL | Status: DC

## 2011-11-22 MED ORDER — CARVEDILOL 3.125 MG PO TABS: 3.1250 mg | ORAL_TABLET | Freq: Two times a day (BID) | ORAL | Status: DC

## 2011-11-22 MED ORDER — LORAZEPAM 1 MG PO TABS
1.0000 mg | ORAL_TABLET | Freq: Once | ORAL | Status: AC
  Administered 2011-11-22: 1 mg via ORAL
  Filled 2011-11-22: qty 1

## 2011-11-22 NOTE — ED Provider Notes (Signed)
History     CSN: [id number]  Arrival date & time 11/22/11  1557   First MD Initiated Contact with Patient 11/22/11 1604      Chief Complaint  Patient presents with  . Chest Pain    (Consider location/radiation/quality/duration/timing/severity/associated sxs/prior treatment) Patient is a 46 y.o. male presenting with chest pain. The history is provided by the patient.  Chest Pain The chest pain began 3 - 5 days ago. Chest pain occurs constantly. The chest pain is unchanged. The pain is associated with stress. At its most intense, the pain is at 3/10. The pain is currently at 3/10. The severity of the pain is mild. The quality of the pain is described as pressure-like. The pain does not radiate. Chest pain is worsened by stress. Pertinent negatives for primary symptoms include no fever, no fatigue, no syncope, no shortness of breath, no cough, no palpitations, no abdominal pain, no nausea, no vomiting and no dizziness.  Pertinent negatives for associated symptoms include no diaphoresis, no lower extremity edema, no near-syncope, no numbness, no orthopnea and no weakness. He tried nitroglycerin (He has run out of several of his medications including carvedilol and Imdur within the past several weeks.) for the symptoms. 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  His past medical history is significant for DVT, hyperlipidemia and MI.  Procedure history is positive for cardiac catheterization and echocardiogram.     Past Medical History  Diagnosis Date  . Asthma   . Myocardial infarct, old     pt D/C  from [facility] 4days ago  . DVT of lower extremity, bilateral     PT reports a HX of  Bil DVT . PT takes warfarin  andeffient  . Hyperlipidemia   . PAD (peripheral artery disease)   . Anxiety     Past Surgical History  Procedure Date  . Cholecystectomy   . Femoral  bypass     info per pt  . Cardiac catheterization     No family history on file.  History  Substance Use Topics  . Smoking status: Current Everyday Smoker -- 1.0 packs/day    Types: Cigarettes  . Smokeless tobacco: Not on file  . Alcohol Use: No      Review of Systems  Constitutional: Negative for fever, diaphoresis and fatigue.  HENT: Negative for congestion, sore throat and neck pain.   Eyes: Negative.   Respiratory: Negative for cough, chest tightness and shortness of breath.   Cardiovascular: Positive for chest pain. Negative for palpitations, orthopnea, leg swelling, syncope and near-syncope.  Gastrointestinal: Negative for nausea, vomiting and abdominal pain.  Genitourinary: Negative.   Musculoskeletal: Negative for joint swelling and arthralgias.  Skin: Negative.  Negative for rash and wound.  Neurological: Negative for dizziness, weakness, light-headedness, numbness and headaches.  Hematological: Negative.   Psychiatric/Behavioral: The patient is nervous/anxious.     Allergies  Review of patient's allergies indicates no known allergies.  Home Medications   Current Outpatient Rx  Name Route Sig Dispense Refill  . ASPIRIN 81 MG PO TBEC Oral Take 81 mg by mouth daily.    Marland Kitchen CARVEDILOL 3.125 MG PO TABS Oral Take 3.125 mg by mouth 2 (two) times daily with a meal.      . DIPHENHYDRAMINE HCL (SLEEP) 25 MG PO TABS Oral Take 25 mg by mouth every 6 (six) hours as needed. For  anxiety     . FEXOFENADINE HCL 180 MG PO TABS Oral Take 180 mg by mouth daily.      Marland Kitchen FLUOXETINE HCL 20 MG PO CAPS Oral Take 20 mg by mouth daily.    . ISOSORBIDE MONONITRATE 15 MG HALF TABLET Oral Take 15 mg by mouth daily.    Marland Kitchen METHOCARBAMOL 750 MG PO TABS Oral Take 1,500 mg by mouth 4 (four) times daily.     Marland Kitchen NITROGLYCERIN 0.4 MG SL SUBL Sublingual Place 0.4 mg under the tongue every 5 (five) minutes as needed. For chest pain.    Marland Kitchen OMEPRAZOLE 20 MG PO CPDR Oral Take 20 mg by mouth daily.      Marland Kitchen  ROSUVASTATIN CALCIUM 40 MG PO TABS Oral Take 40 mg by mouth daily.     . WARFARIN SODIUM 5 MG PO TABS Oral Take 7.5-10 mg by mouth daily. Take 2 tablets on Monday, Wednesday, Friday and then take 1.5 tablets all other days    . ZOLPIDEM TARTRATE 5 MG PO TABS Oral Take 5 mg by mouth at bedtime as needed. For sleep.    Marland Kitchen CARVEDILOL 3.125 MG PO TABS Oral Take 1 tablet (3.125 mg total) by mouth 2 (two) times daily with a meal. 60 tablet 0  . ISOSORBIDE MONONITRATE 15 MG HALF TABLET Oral Take 0.5 tablets (15 mg total) by mouth daily. 30 tablet 0  . LORAZEPAM 1 MG PO TABS Oral Take 1 tablet (1 mg total) by mouth 3 (three) times daily as needed for anxiety. 20 tablet 0    BP 110/79  Pulse 89  Resp 15  SpO2 100%  Physical Exam  Nursing note and vitals reviewed. Constitutional: He is oriented to person, place, and time. He appears well-developed and well-nourished.  HENT:  Head: Normocephalic and atraumatic.  Eyes: Conjunctivae are normal.  Neck: Normal range of motion.  Cardiovascular: Normal rate, regular rhythm, normal heart sounds and intact distal pulses.   Pulmonary/Chest: Effort normal and breath sounds normal. He has no wheezes.  Abdominal: Soft. Bowel sounds are normal. There is no tenderness.  Musculoskeletal: Normal range of motion.  Neurological: He is alert and oriented to person, place, and time.  Skin: Skin is warm and dry.  Psychiatric: He has a normal mood and affect.    ED Course  Procedures (including critical care time)  Labs Reviewed  PROTIME-INR - Abnormal; Notable for the following:    Prothrombin Time 15.5 (*)    All other components within normal limits  CBC  POCT I-STAT, CHEM 8  POCT I-STAT TROPONIN I   Dg Chest Portable 1 View  11/22/2011  *RADIOLOGY REPORT*  Clinical Data: Chest pain  PORTABLE CHEST - 1 VIEW  Comparison:  10/02/2011  Findings: Cardiomediastinal silhouette is stable.  No acute infiltrate or pleural effusion.  No pulmonary edema.  Minimal  left basilar atelectasis. Bony thorax is stable.  IMPRESSION: No acute infiltrate or pulmonary edema.  Minimal left basilar atelectasis.  Original Report Authenticated By: Natasha Mead, M.D.     1. Chest pain   2. Non compliance w medication regimen   3. Subtherapeutic international normalized ratio (INR)   4. Anxiety     Patient has been satting 99-100% on room air during this visit.  MDM  Patient with chronic midsternal chest pressure 3 days duration with no changes in EKG and no elevation in troponin level.  Symptoms improved with Ativan given.  Suspect symptoms related to anxiety.  Small quantities of Ativan  prescribed along with the refill of important medicines which has recently run out of, namely isosorbide, carvedilol.  Advised to increase Coumadin to 10 mg daily for the next 3 days, then back to his routine Coumadin schedule.  Patient to call Dr. Michel Harrow office for a recheck of his INR early next week.    Date: 11/22/2011  Rate: 71  Rhythm: normal sinus rhythm  QRS Axis: right  Intervals: normal  ST/T Wave abnormalities: T wave inversion III, aVF and V4-6.  Conduction Disutrbances:none  Narrative Interpretation:   Old EKG Reviewed: unchanged       Candis Musa, PA 11/22/11 2031

## 2011-11-22 NOTE — ED Notes (Signed)
Placed on  Cardiac  Monitor  Nasal o2  At  2 l  /  Min

## 2011-11-22 NOTE — ED Notes (Signed)
Pt c/o mid sternal pressure. States "I think it's the anxiety, I've been really stressed lately." NAD noted. NSR on monitor.

## 2011-11-22 NOTE — Discharge Instructions (Signed)
As discussed,  Take your coumadin 10 mg daily for 3 days,  Then return to your normal dosing schedule.  You need to call your doctor for a recheck of your symptoms early next week and to have your INR rechecked as well.

## 2011-11-22 NOTE — ED Provider Notes (Signed)
Patient was interviewed and examined. He is calm and comfortable. He relates being out of these Ativan, and not having a primary care doctor. He has used a whole bottle of sublingual nitroglycerin in the last month. 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 He is not taking long-acting nitrates because of finances. He is also taking his Coumadin by his own schedule and not as dosed by his Coumadin clinic. He has not seen his cardiologist in 6-8 weeks.  Medical screening examination/treatment/procedure(s) were conducted as a shared visit with non-physician practitioner(s) and myself.  I personally evaluated the patient during the encounter  Flint Melter, MD 11/24/11 1230

## 2011-11-22 NOTE — ED Notes (Signed)
Pt  Has  Cardiac  History  He  Reports    Chest  Pain   For  Weeks   With  Mixed  Anxiety     As      Well    He  Reports  His  Pain scale  Is  3   And  He  desctribes  It as  A      Squeezing

## 2011-11-22 NOTE — ED Provider Notes (Signed)
History     CSN: [id number]  Arrival date & time 11/22/11  1420   First MD Initiated Contact with Patient 11/22/11 1453      Chief Complaint  Patient presents with  . Chest Pain    (Consider location/radiation/quality/duration/timing/severity/associated sxs/prior treatment) HPI Comments: Patient with substernal, nonradiating squeezing, pressure like chest pain and abdominal pain over the past few days. Patient states he's had similar symptoms since his heart attack in October 2012, but that it has gotten worse over the past few days. Symptoms are worse with lying down. No exertional component. No diaphoresis, nausea, vomiting, fevers, coughing, wheezing, shortness of breath. Patient states that he has been under a significant amount of stress recently, but that this feels similar to his previous anginal episodes. Patient tried some nitroglycerin SL earlier without relief. Patient has been seen in the ER twice with similar presentations, both times thought to have anxiety. Patient is noncompliant with all medications except for warfarin, aspirin, nitroglycerin.  ROS as noted in HPI. All other ROS negative.   Patient is a 46 y.o. male presenting with chest pain. The history is provided by the patient.  Chest Pain The chest pain began 3 - 5 days ago. Chest pain occurs constantly. The chest pain is worsening. The quality of the pain is described as similar to previous episodes, pressure-like and squeezing. The pain does not radiate. Chest pain is worsened by certain positions. Primary symptoms include abdominal pain. Pertinent negatives for primary symptoms include no fever, no shortness of breath, no cough, no wheezing and no nausea.  Pertinent negatives for associated symptoms include no claudication and no lower extremity edema. He tried nitroglycerin for the symptoms. Risk factors include smoking/tobacco exposure and male gender.  His past medical history is significant for anxiety/panic  attacks, CAD, DVT, MI and PVD.  Procedure history is positive for cardiac catheterization.     Past Medical History  Diagnosis Date  . Asthma   . Myocardial infarct, old     pt D/C  from [facility] 4days ago  . DVT of lower extremity, bilateral     PT reports a HX of  Bil DVT . PT takes warfarin  andeffient  . Hyperlipidemia   . PAD (peripheral artery disease)   . Anxiety     Past Surgical History  Procedure Date  . Cholecystectomy   . Femoral bypass     info per pt  . Cardiac catheterization     History reviewed. No pertinent family history.  History  Substance Use Topics  . Smoking status: Current Everyday Smoker -- 1.0 packs/day    Types: Cigarettes  . Smokeless tobacco: Not on file  . Alcohol Use: No      Review of Systems  Constitutional: Negative for fever.  Respiratory: Negative for cough, shortness of breath and wheezing.   Cardiovascular: Positive for chest pain. Negative for claudication.  Gastrointestinal: Positive for abdominal pain. Negative for nausea.    Allergies  Review of patient's allergies indicates no known allergies.  Home Medications   Current Outpatient Rx  Name Route Sig Dispense Refill  . ROSUVASTATIN CALCIUM 40 MG PO TABS Oral Take 40 mg by mouth daily.     . ASPIRIN 81 MG PO TBEC Oral Take 1 tablet (81 mg total) by mouth daily. 30 tablet 0  . CARVEDILOL 3.125 MG PO TABS Oral Take 3.125 mg by mouth 2 (two) times daily with a meal.      . DIPHENHYDRAMINE HCL (SLEEP) 25 MG  PO TABS Oral Take 25 mg by mouth every 6 (six) hours as needed. For anxiety     . FEXOFENADINE HCL 180 MG PO TABS Oral Take 180 mg by mouth daily.      Marland Kitchen FLUOXETINE HCL 20 MG PO CAPS Oral Take 1 capsule (20 mg total) by mouth daily. 30 capsule 1  . ISOSORBIDE MONONITRATE 15 MG HALF TABLET Oral Take 0.5 tablets (15 mg total) by mouth daily. 30 tablet 3  . METHOCARBAMOL 750 MG PO TABS Oral Take 1,500 mg by mouth 4 (four) times daily. 2 tabs PO 4 times a day    .  NITROGLYCERIN 0.4 MG SL SUBL Sublingual Place 1 tablet (0.4 mg total) under the tongue every 5 (five) minutes as needed for chest pain (DO NOT TAKE MORE THAN THREE(3) TABLETS.). 25 tablet 2  . OMEPRAZOLE 20 MG PO CPDR Oral Take 20 mg by mouth daily.      . OXYCODONE-ACETAMINOPHEN 5-325 MG PO TABS Oral Take 1-2 tablets by mouth every 6 (six) hours as needed. For pain    . WARFARIN SODIUM 5 MG PO TABS Oral Take 7.5-10 mg by mouth daily. Take 2 tablets on Monday, Wednesday, Friday and then take 1.5 tablets all other days    . ZOLPIDEM TARTRATE 5 MG PO TABS Oral Take 5 mg by mouth at bedtime as needed.        BP 119/82  Pulse 82  Temp(Src) 98.2 F (36.8 C) (Oral)  Resp 18  SpO2 97%  Physical Exam  Nursing note and vitals reviewed. Constitutional: He is oriented to person, place, and time. He appears well-developed and well-nourished.       Appears anxious  HENT:  Head: Normocephalic and atraumatic.  Eyes: Conjunctivae and EOM are normal.  Neck: Normal range of motion.  Cardiovascular: Normal rate, regular rhythm, normal heart sounds and intact distal pulses.   Pulmonary/Chest: Effort normal and breath sounds normal.  Abdominal: Soft. Normal appearance and bowel sounds are normal. He exhibits no distension. There is no tenderness. There is no rebound.  Musculoskeletal: Normal range of motion. He exhibits no edema.  Neurological: He is alert and oriented to person, place, and time.  Skin: Skin is warm and dry.  Psychiatric: He has a normal mood and affect. His behavior is normal.    ED Course  Procedures (including critical care time)  Labs Reviewed - No data to display No results found.   1. Chest pain     EKG: Normal sinus rhythm, rate 83. Normal intervals. Right axis deviation.  Q waves inferiorly in III, aVF. T-wave inversion in III. This is present in previous EKG from January 2013. No acute ST-T wave changes.  MDM  Previous records, labs reviewed. As noted in history of  present illness. Patient presents with substernal chest pressure, and abdominal pain, getting worse over the past few days. Patient has a history significant for stemi and peripheral vascular disease. Vital signs acceptable. EKG unchanged from previous EKG in Jan 2013, however, cannot rule out cardiac etiology of his symptoms. Transferring to the ER.   Luiz Blare, MD 11/22/11 [phone]

## 2011-11-22 NOTE — ED Notes (Signed)
Per ems- pt c/o midsternal chest pain xcouple days. Pt c/o inc anxiety. Describes pain as pressure. Pt NSR on monitor. Pt received 81mg  asa, 20g IV r hand. NAD noted.

## 2011-11-24 NOTE — ED Provider Notes (Signed)
Medical screening examination/treatment/procedure(s) were conducted as a shared visit with non-physician practitioner(s) and myself.  I personally evaluated the patient during the encounter  Flint Melter, MD 11/24/11 [phone]

## 2011-12-13 ENCOUNTER — Encounter: Payer: Self-pay | Admitting: Internal Medicine

## 2012-02-08 ENCOUNTER — Observation Stay (HOSPITAL_COMMUNITY)
Admission: EM | Admit: 2012-02-08 | Discharge: 2012-02-10 | Disposition: A | Discharge status: Home / Self Care | Payer: Medicare Other | Attending: Cardiovascular Disease | Admitting: Cardiovascular Disease

## 2012-02-08 ENCOUNTER — Emergency Department (HOSPITAL_COMMUNITY): Payer: Medicare Other

## 2012-02-08 ENCOUNTER — Other Ambulatory Visit: Payer: Self-pay

## 2012-02-08 ENCOUNTER — Encounter (HOSPITAL_COMMUNITY): Payer: Self-pay

## 2012-02-08 DIAGNOSIS — F419 Anxiety disorder, unspecified: Secondary | ICD-10-CM | POA: Diagnosis present

## 2012-02-08 DIAGNOSIS — R791 Abnormal coagulation profile: Secondary | ICD-10-CM | POA: Insufficient documentation

## 2012-02-08 DIAGNOSIS — F1410 Cocaine abuse, uncomplicated: Secondary | ICD-10-CM | POA: Diagnosis present

## 2012-02-08 DIAGNOSIS — F4310 Post-traumatic stress disorder, unspecified: Secondary | ICD-10-CM | POA: Diagnosis present

## 2012-02-08 DIAGNOSIS — I252 Old myocardial infarction: Secondary | ICD-10-CM | POA: Insufficient documentation

## 2012-02-08 DIAGNOSIS — F17200 Nicotine dependence, unspecified, uncomplicated: Secondary | ICD-10-CM | POA: Diagnosis present

## 2012-02-08 DIAGNOSIS — Z86718 Personal history of other venous thrombosis and embolism: Secondary | ICD-10-CM | POA: Insufficient documentation

## 2012-02-08 DIAGNOSIS — K219 Gastro-esophageal reflux disease without esophagitis: Secondary | ICD-10-CM | POA: Diagnosis present

## 2012-02-08 DIAGNOSIS — F411 Generalized anxiety disorder: Secondary | ICD-10-CM | POA: Insufficient documentation

## 2012-02-08 DIAGNOSIS — J45909 Unspecified asthma, uncomplicated: Secondary | ICD-10-CM | POA: Insufficient documentation

## 2012-02-08 DIAGNOSIS — E785 Hyperlipidemia, unspecified: Secondary | ICD-10-CM | POA: Diagnosis present

## 2012-02-08 DIAGNOSIS — I7389 Other specified peripheral vascular diseases: Secondary | ICD-10-CM | POA: Diagnosis present

## 2012-02-08 DIAGNOSIS — I739 Peripheral vascular disease, unspecified: Secondary | ICD-10-CM | POA: Insufficient documentation

## 2012-02-08 DIAGNOSIS — I2510 Atherosclerotic heart disease of native coronary artery without angina pectoris: Secondary | ICD-10-CM | POA: Diagnosis present

## 2012-02-08 DIAGNOSIS — R0602 Shortness of breath: Secondary | ICD-10-CM | POA: Insufficient documentation

## 2012-02-08 DIAGNOSIS — R079 Chest pain, unspecified: Principal | ICD-10-CM | POA: Diagnosis present

## 2012-02-08 LAB — CBC
Hemoglobin: 14.6 g/dL (ref 13.0–17.0)
MCH: 26.2 pg (ref 26.0–34.0)
Platelets: 243 10*3/uL (ref 150–400)
RBC: 5.57 MIL/uL (ref 4.22–5.81)
WBC: 8.9 10*3/uL (ref 4.0–10.5)

## 2012-02-08 LAB — CARDIAC PANEL(CRET KIN+CKTOT+MB+TROPI)
CK, MB: 2.9 ng/mL (ref 0.3–4.0)
Relative Index: 2.2 (ref 0.0–2.5)
Total CK: 129 U/L (ref 7–232)
Total CK: 121 U/L (ref 7–232)
Troponin I: <0.3 ng/mL (ref <0.30)

## 2012-02-08 LAB — HEMOGLOBIN A1C: Mean Plasma Glucose: 105 mg/dL (ref <117)

## 2012-02-08 LAB — POCT I-STAT, CHEM 8
Calcium, Ion: 1.13 mmol/L (ref 1.12–1.32)
Chloride: 108 mEq/L (ref 96–112)
Glucose, Bld: 98 mg/dL (ref 70–99)
HCT: 47 % (ref 39.0–52.0)
TCO2: 23 mmol/L (ref 0–100)

## 2012-02-08 LAB — DIFFERENTIAL
Eosinophils Absolute: 0.2 10*3/uL (ref 0.0–0.7)
Lymphocytes Relative: 21 % (ref 12–46)
Lymphs Abs: 1.9 10*3/uL (ref 0.7–4.0)
Monocytes Relative: 10 % (ref 3–12)
Neutro Abs: 6 10*3/uL (ref 1.7–7.7)
Neutrophils Relative %: 67 % (ref 43–77)

## 2012-02-08 LAB — TSH: TSH: 0.646 u[IU]/mL (ref 0.350–4.500)

## 2012-02-08 LAB — RAPID URINE DRUG SCREEN, HOSP PERFORMED
Benzodiazepines: NOT DETECTED
Cocaine: POSITIVE — AB
Opiates: NOT DETECTED
Tetrahydrocannabinol: NOT DETECTED

## 2012-02-08 LAB — POCT I-STAT TROPONIN I

## 2012-02-08 MED ORDER — ENOXAPARIN SODIUM 80 MG/0.8ML ~~LOC~~ SOLN
80.0000 mg | Freq: Two times a day (BID) | SUBCUTANEOUS | Status: DC
  Administered 2012-02-09 – 2012-02-10 (×3): 80 mg via SUBCUTANEOUS
  Filled 2012-02-08 (×5): qty 0.8

## 2012-02-08 MED ORDER — ENOXAPARIN SODIUM 80 MG/0.8ML ~~LOC~~ SOLN
80.0000 mg | Freq: Two times a day (BID) | SUBCUTANEOUS | Status: DC
  Filled 2012-02-08: qty 0.8

## 2012-02-08 MED ORDER — ASPIRIN 81 MG PO CHEW
CHEWABLE_TABLET | ORAL | Status: AC
  Administered 2012-02-08: 81 mg
  Filled 2012-02-08: qty 1

## 2012-02-08 MED ORDER — ENOXAPARIN SODIUM 80 MG/0.8ML ~~LOC~~ SOLN
80.0000 mg | Freq: Once | SUBCUTANEOUS | Status: AC
  Administered 2012-02-08: 80 mg via SUBCUTANEOUS
  Filled 2012-02-08: qty 0.8

## 2012-02-08 MED ORDER — ASPIRIN EC 81 MG PO TBEC
81.0000 mg | DELAYED_RELEASE_TABLET | Freq: Every day | ORAL | Status: DC
  Administered 2012-02-09 – 2012-02-10 (×2): 81 mg via ORAL
  Filled 2012-02-08 (×3): qty 1

## 2012-02-08 MED ORDER — ASPIRIN 81 MG PO TBEC: 81.0000 mg | DELAYED_RELEASE_TABLET | Freq: Every day | ORAL | Status: DC

## 2012-02-08 MED ORDER — ATORVASTATIN CALCIUM 20 MG PO TABS
20.0000 mg | ORAL_TABLET | Freq: Every day | ORAL | Status: DC
  Administered 2012-02-08 – 2012-02-09 (×2): 20 mg via ORAL
  Filled 2012-02-08 (×3): qty 1

## 2012-02-08 MED ORDER — LORAZEPAM 2 MG/ML IJ SOLN
1.0000 mg | Freq: Once | INTRAMUSCULAR | Status: AC
  Administered 2012-02-08: 1 mg via INTRAVENOUS
  Filled 2012-02-08: qty 1

## 2012-02-08 MED ORDER — WARFARIN - PHARMACIST DOSING INPATIENT
Freq: Every day | Status: DC
  Administered 2012-02-09: 18:00:00

## 2012-02-08 MED ORDER — WARFARIN SODIUM 10 MG PO TABS
10.0000 mg | ORAL_TABLET | Freq: Once | ORAL | Status: AC
  Administered 2012-02-08: 10 mg via ORAL
  Filled 2012-02-08: qty 1

## 2012-02-08 MED ORDER — ACETAMINOPHEN 325 MG PO TABS
650.0000 mg | ORAL_TABLET | ORAL | Status: DC | PRN
  Administered 2012-02-08 – 2012-02-10 (×4): 650 mg via ORAL
  Filled 2012-02-08 (×4): qty 2

## 2012-02-08 MED ORDER — ONDANSETRON HCL 4 MG/2ML IJ SOLN: 4.0000 mg | Freq: Four times a day (QID) | INTRAMUSCULAR | Status: DC | PRN

## 2012-02-08 MED ORDER — PANTOPRAZOLE SODIUM 40 MG PO TBEC
40.0000 mg | DELAYED_RELEASE_TABLET | Freq: Every day | ORAL | Status: DC
  Administered 2012-02-09: 40 mg via ORAL
  Filled 2012-02-08: qty 1

## 2012-02-08 MED ORDER — NITROGLYCERIN 2 % TD OINT
1.0000 [in_us] | TOPICAL_OINTMENT | Freq: Four times a day (QID) | TRANSDERMAL | Status: DC
  Administered 2012-02-08 – 2012-02-10 (×7): 1 [in_us] via TOPICAL
  Filled 2012-02-08: qty 30
  Filled 2012-02-08: qty 1

## 2012-02-08 MED ORDER — LORAZEPAM 0.5 MG PO TABS
1.0000 mg | ORAL_TABLET | Freq: Three times a day (TID) | ORAL | Status: DC | PRN
  Administered 2012-02-08 – 2012-02-09 (×4): 1 mg via ORAL
  Filled 2012-02-08 (×2): qty 2
  Filled 2012-02-08: qty 1
  Filled 2012-02-08: qty 2

## 2012-02-08 NOTE — H&P (Signed)
[name] is an 46 y.o. male.   Chief Complaint:  Chest pain HPI:   The patient is a 46 year old Caucasian male with history of coronary artery disease previous Demi 07/23/2011 was taken acutely to the cardiac cath lab which revealed evidence of abrupt distal occlusion of 3 distal branches of the circumflex coronary artery. These were small in caliber and not amenable to intervention. He was also found to have mild spasm in the right coronary artery at the mid segment which improved E. Nitroglycerin. His initial ejection fraction was 50-55% he did have a focal distal inferior wall hypocontractility. His subsequent echocardiogram which suggested a mobile apical thrombus for which he was coumadinized.  His history also includes cocaine abuse, bilateral lower extremity DVTs, anxiety, depression, hyperlipidemia, peripheral artery disease, asthma, tobacco abuse.  Patient had thrombectomy of his left superficial femoral artery and left profunda femoral artery remotely.  Patient last saw Dr. Tresa Endo on 08/15/2011 at which time he was taken off of Effient and  continued on aspirin  The patient presents today with a subtherapeutic INR and with complaints of chest pain. He describes the pain as "pressure-like" and it woke him up at 063 hours this morning. The pain was 10 out of 10 in intensity and currently is 5/10 in intensity. He had no radiation of the pain to his neck, arm, back, or jaw. He did say that he felt uncomfortable down into his stomach area. He also has had associated shortness of breath and maybe a little diaphoresis. He denies nausea, vomiting, fever, palpitations, cough, congestion, orthopnea, lower extremity edema, abdominal pain, dysuria, hematuria, hematochezia, melena.  Patient does report occasional "numbness in his stomach". He states that a period depend on his position and is intermittent. He just reports using cocaine approximately 3 days ago as well as being dizzy 3 days ago.  Past  Medical History  Diagnosis Date  . Asthma   . Myocardial infarct, old     pt D/C  from [facility] 4[facility] ago  . DVT of lower extremity, bilateral     PT reports a HX of  Bil DVT . PT takes warfarin  andeffient  . Hyperlipidemia   . PAD (peripheral artery disease)   . Anxiety     Past Surgical History  Procedure Date  . Cholecystectomy   . Femoral bypass     info per pt  . Cardiac catheterization     History reviewed. No pertinent family history. Social History:  reports that he has been smoking Cigarettes.  He has been smoking about 1 pack per day. He does not have any smokeless tobacco history on file. He reports that he does not drink alcohol or use illicit drugs.  Allergies: No Known Allergies   (Not in a hospital admission)  Results for orders placed during the hospital encounter of 02/08/12 (from the past 48 hour(s))  CBC     Status: Abnormal   Collection Time   02/08/12  8:16 AM      Component Value Range Comment   WBC 8.9  4.0 - 10.5 (K/uL)    RBC 5.57  4.22 - 5.81 (MIL/uL)    Hemoglobin 14.6  13.0 - 17.0 (g/dL)    HCT 78.2  95.6 - 21.3 (%)    MCV 78.5  78.0 - 100.0 (fL)    MCH 26.2  26.0 - 34.0 (pg)    MCHC 33.4  30.0 - 36.0 (g/dL)    RDW 08.6 (*) 57.8 - 15.5 (%)  Platelets 243  150 - 400 (K/uL)   DIFFERENTIAL     Status: Normal   Collection Time   02/08/12  8:16 AM      Component Value Range Comment   Neutrophils Relative 67  43 - 77 (%)    Neutro Abs 6.0  1.7 - 7.7 (K/uL)    Lymphocytes Relative 21  12 - 46 (%)    Lymphs Abs 1.9  0.7 - 4.0 (K/uL)    Monocytes Relative 10  3 - 12 (%)    Monocytes Absolute 0.9  0.1 - 1.0 (K/uL)    Eosinophils Relative 2  0 - 5 (%)    Eosinophils Absolute 0.2  0.0 - 0.7 (K/uL)    Basophils Relative 1  0 - 1 (%)    Basophils Absolute 0.0  0.0 - 0.1 (K/uL)   POCT I-STAT TROPONIN I     Status: Normal   Collection Time   02/08/12  8:25 AM      Component Value Range Comment   Troponin i, poc 0.00  0.00 - 0.08 (ng/mL)    Comment 3             POCT I-STAT, CHEM 8     Status: Normal   Collection Time   02/08/12  8:26 AM      Component Value Range Comment   Sodium 142  135 - 145 (mEq/L)    Potassium 3.7  3.5 - 5.1 (mEq/L)    Chloride 108  96 - 112 (mEq/L)    BUN 7  6 - 23 (mg/dL)    Creatinine, Ser 1.61  0.50 - 1.35 (mg/dL)    Glucose, Bld 98  70 - 99 (mg/dL)    Calcium, Ion 0.96  1.12 - 1.32 (mmol/L)    TCO2 23  0 - 100 (mmol/L)    Hemoglobin 16.0  13.0 - 17.0 (g/dL)    HCT 04.5  40.9 - 81.1 (%)   PROTIME-INR     Status: Abnormal   Collection Time   02/08/12  8:37 AM      Component Value Range Comment   Prothrombin Time 17.2 (*) 11.6 - 15.2 (seconds)    INR 1.38  0.00 - 1.49     Dg Chest Portable 1 View  02/08/2012  *RADIOLOGY REPORT*  Clinical Data: History of chest pain.  History of hypertension.  PORTABLE CHEST - 1 VIEW  Comparison: 11/22/2011.  Findings: Cardiac silhouette is upper range normal size.  It is normal shape.  Mediastinal and hilar contours appear stable.  No pulmonary infiltrates or nodules were evident. No pleural abnormality is evident. Bones appear average for age.  IMPRESSION: No acute or active cardiopulmonary or pleural abnormalities are evident.  Original Report Authenticated By: Crawford Givens, M.D.    Review of Systems  Constitutional: Positive for diaphoresis (a little). Negative for fever.  HENT: Negative for congestion and sore throat.   Eyes: Positive for double vision. Negative for blurred vision.  Respiratory: Positive for shortness of breath. Negative for cough.   Cardiovascular: Positive for chest pain. Negative for palpitations, orthopnea, leg swelling and PND.  Gastrointestinal: Negative for nausea, vomiting, abdominal pain, diarrhea, constipation, blood in stool and melena.  Genitourinary: Negative for dysuria and hematuria.  Musculoskeletal: Negative for myalgias.  Neurological: Positive for dizziness (3 days ago). Negative for headaches.  Psychiatric/Behavioral: The patient is  nervous/anxious.     Blood pressure 110/77, pulse 87, temperature 98 F (36.7 C), temperature source Oral, resp. rate 20, SpO2  96.00%. Physical Exam  Constitutional: He is oriented to person, place, and time. He appears well-developed. No distress.       Overweight  HENT:  Head: Normocephalic and atraumatic.  Mouth/Throat: Oropharynx is clear and moist. No oropharyngeal exudate.  Eyes: EOM are normal. Pupils are equal, round, and reactive to light. No scleral icterus.  Neck: Normal range of motion. Neck supple. No JVD present.  Cardiovascular: Normal rate and regular rhythm.   No murmur heard. Pulses:      Radial pulses are 2+ on the right side, and 2+ on the left side.       Dorsalis pedis pulses are 0 on the right side, and 0 on the left side.       Posterior tibial pulses are 1+ on the right side, and 1+ on the left side.       No carotid or femoral bruits  Respiratory: Effort normal and breath sounds normal. He has no wheezes. He has no rales.  GI: Soft. Bowel sounds are normal. He exhibits no distension. There is no tenderness.  Musculoskeletal: He exhibits no edema.  Lymphadenopathy:    He has no cervical adenopathy.  Neurological: He is alert and oriented to person, place, and time. He exhibits normal muscle tone.  Skin: Skin is warm and dry.  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 Start him on nitroglycerin paste as well as full dose Lovenox until INR is therapeutic. We'll continue Coumadin. Continue Ativan for anxiety.  We'll request tobacco cessation consult as well as drug abuse counsel.  We will avoid beta  blockers due to cocaine use.  We'll obtain urine drug screen.  Continue to monitor heart rate and blood pressure.    HAGER,BRYAN W 02/08/2012, 10:05 AM   I have seen and examined the patient along with Dwana Melena, PA.  I have reviewed the chart, notes and new data.  I agree with PA's note.  Key new complaints: chest pain is much improved - sleeping now Key examination changes: normal cardiovascular exam Key new findings / data: very subtle ST segment changes in inferior leads and V5V6 (where there are already Q waves); negative cardiac enzymes (still very early)  PLAN: Likely cocaine induced vasospasm, but cannot fully exclude cardioembolic coronary event (which is what may have occurred in October). Anticoagulate and check echo. If there is still LV apical thrombus, keep on injectable anticoagulants until INR reaches therapeutic level.  Substance abuse counseling.  Thurmon Fair, MD, [facility] [phone] 02/08/2012, 10:30 AM

## 2012-02-08 NOTE — ED Notes (Signed)
Awaiting [place] cards

## 2012-02-08 NOTE — ED Notes (Signed)
Patient again states he cannot void at this time.

## 2012-02-08 NOTE — Progress Notes (Signed)
ANTICOAGULATION CONSULT NOTE - Initial Consult  Pharmacy Consult for Warfarin, Lovenox Indication: History of Apical thrombus  No Known Allergies  Patient Measurements:    08/08/11 Height 50ft 10 in weight 188 lb (85kg)  Vital Signs: Temp: 98 F (36.7 C) (05/10 0744) Temp src: Oral (05/10 0744) BP: 95/59 mmHg (05/10 1310) Pulse Rate: 68  (05/10 1310)  Labs:  Basename 02/08/12 0837 02/08/12 0826 02/08/12 0816  HGB -- 16.0 14.6  HCT -- 47.0 43.7  PLT -- -- 243  APTT -- -- --  LABPROT 17.2* -- --  INR 1.38 -- --  HEPARINUNFRC -- -- --  CREATININE -- 1.00 --  CKTOTAL -- -- --  CKMB -- -- --  TROPONINI -- -- --    The CrCl is unknown because both a height and weight (above a minimum accepted value) are required for this calculation.   Medical History: Past Medical History  Diagnosis Date  . Asthma   . Myocardial infarct, old     pt D/C  from [facility] 4days ago  . DVT of lower extremity, bilateral     PT reports a HX of  Bil DVT . PT takes warfarin  andeffient  . Hyperlipidemia   . PAD (peripheral artery disease)   . Anxiety     Medications:   (Not in a hospital admission)  Admit Complaint: 46 y.o.  male  admitted 02/08/2012 with chest pain.  Pharmacy consulted to dose lovenox and warfarin for history of apical thrombus  Warfarin PTA: 10 mg MWF, 7.5 mg other days of the week  Assessment: Anticoagulation: Apical thrombus post MI, h/o DVT Cardiovascular: CAD, STEMI 10/12, hyperlipidemia, PVD,  Neurology: Anxiety, Depression, recent cocaine use  Goal of Therapy:  Anti-Xa level 0.6-1.2 units/ml 4hrs after LMWH dose given INR 2-3 Monitor platelets by anticoagulation protocol: Yes   Plan:  Lovenox 80 mg sq q12h Warfarin 10 mg tonight CBC q72h, INR daily Verify current weight   Thank you for allowing pharmacy to be a part of this patients care team.  Lovenia Kim Pharm.D., BCPS Clinical Pharmacist 02/08/2012 2:04 PM Pager: [phone] Phone: [phone]

## 2012-02-08 NOTE — ED Notes (Signed)
Admission md in ed to see pt now.

## 2012-02-08 NOTE — ED Notes (Signed)
Pt given Ativan 1mg  per order.  St's that is not gonna be enough.  Explained to him that I could not be able to give anymore until ordered by MD>

## 2012-02-08 NOTE — ED Notes (Signed)
Attempted to call report ot [facility]

## 2012-02-08 NOTE — ED Notes (Signed)
Reminded patient we need a urine sample.  Patient has a urinal. 

## 2012-02-08 NOTE — ED Notes (Signed)
Pt continues to c/o chest pain rates a #4 on pain scale 0/10.  Pt requesting something for anxiety.  Skin warm and dry color appropriate.

## 2012-02-08 NOTE — ED Notes (Signed)
Awaiting orders from admission md.

## 2012-02-08 NOTE — ED Provider Notes (Signed)
History     CSN: [id number]  Arrival date & time 02/08/12  0745   First MD Initiated Contact with Patient 02/08/12 [phone]      Chief Complaint  Patient presents with  . Chest Pain    (Consider location/radiation/quality/duration/timing/severity/associated sxs/prior treatment) HPI Complains of left anterior chest pain typical of "heart pain" he said in the past onset 4:30 AM today accompanied by shortness of breath pain is nonradiating severe pressure-like associated symptoms include shortness of breath no nausea no sweatiness treated by EMS with aspirin and 2 sublingual nitroglycerin without relief. Patient admits to cocaine use this time 3 days ago. Associated symptoms include anxiety No other associated symptoms nothing makes symptoms better or worse. Admits to noncompliance with medication however he states he's been taking his Coumadin appropriately Past Medical History  Diagnosis Date  . Asthma   . Myocardial infarct, old     pt D/C  from [facility] ago  . DVT of lower extremity, bilateral     PT reports a HX of  Bil DVT . PT takes warfarin  andeffient  . Hyperlipidemia   . PAD (peripheral artery disease)   . Anxiety     Past Surgical History  Procedure Date  . Cholecystectomy   . Femoral bypass     info per pt  . Cardiac catheterization     History reviewed. No pertinent family history.  History  Substance Use Topics  . Smoking status: Current Everyday Smoker -- 1.0 packs/day    Types: Cigarettes  . Smokeless tobacco: Not on file  . Alcohol Use: No      Review of Systems  Constitutional: Negative.   HENT: Negative.   Respiratory: Positive for shortness of breath.   Cardiovascular: Positive for chest pain.  Gastrointestinal: Negative.   Musculoskeletal: Negative.   Skin: Negative.   Neurological: Negative.   Hematological: Negative.   Psychiatric/Behavioral: Negative.   All other systems reviewed and are negative.    Allergies  Review of patient's  allergies indicates no known allergies.  Home Medications   Current Outpatient Rx  Name Route Sig Dispense Refill  . ASPIRIN 81 MG PO TBEC Oral Take 81 mg by mouth daily.    Marland Kitchen CARVEDILOL 3.125 MG PO TABS Oral Take 3.125 mg by mouth 2 (two) times daily with a meal.      . CARVEDILOL 3.125 MG PO TABS Oral Take 1 tablet (3.125 mg total) by mouth 2 (two) times daily with a meal. 60 tablet 0  . DIPHENHYDRAMINE HCL (SLEEP) 25 MG PO TABS Oral Take 25 mg by mouth every 6 (six) hours as needed. For anxiety     . FEXOFENADINE HCL 180 MG PO TABS Oral Take 180 mg by mouth daily.      Marland Kitchen FLUOXETINE HCL 20 MG PO CAPS Oral Take 20 mg by mouth daily.    . ISOSORBIDE MONONITRATE 15 MG HALF TABLET Oral Take 15 mg by mouth daily.    . ISOSORBIDE MONONITRATE 15 MG HALF TABLET Oral Take 0.5 tablets (15 mg total) by mouth daily. 30 tablet 0  . METHOCARBAMOL 750 MG PO TABS Oral Take 1,500 mg by mouth 4 (four) times daily.     Marland Kitchen NITROGLYCERIN 0.4 MG SL SUBL Sublingual Place 0.4 mg under the tongue every 5 (five) minutes as needed. For chest pain.    Marland Kitchen OMEPRAZOLE 20 MG PO CPDR Oral Take 20 mg by mouth daily.      Marland Kitchen ROSUVASTATIN CALCIUM 40 MG PO TABS  Oral Take 40 mg by mouth daily.     . WARFARIN SODIUM 5 MG PO TABS Oral Take 7.5-10 mg by mouth daily. Take 2 tablets on Monday, Wednesday, Friday and then take 1.5 tablets all other days    . ZOLPIDEM TARTRATE 5 MG PO TABS Oral Take 5 mg by mouth at bedtime as needed. For sleep.      BP 102/65  Pulse 103  Temp(Src) 98 F (36.7 C) (Oral)  Resp 12  SpO2 100%  Physical Exam  Nursing note and vitals reviewed. Constitutional: He appears well-developed and well-nourished. He appears distressed.       Anxious appearing  HENT:  Head: Normocephalic and atraumatic.  Eyes: Conjunctivae are normal. Pupils are equal, round, and reactive to light.  Neck: Neck supple. No tracheal deviation present. No thyromegaly present.  Cardiovascular: Normal rate and regular rhythm.     No murmur heard. Pulmonary/Chest: Effort normal and breath sounds normal.  Abdominal: Soft. Bowel sounds are normal. He exhibits no distension. There is no tenderness.  Musculoskeletal: Normal range of motion. He exhibits no edema and no tenderness.  Neurological: He is alert. Coordination normal.  Skin: Skin is warm and dry. No rash noted.  Psychiatric: He has a normal mood and affect.    Date: 02/08/2012  Rate: 102  Rhythm: sinus tachycardia  QRS Axis: right  Intervals: normal  ST/T Wave abnormalities: normal  Conduction Disutrbances:none  Narrative Interpretation:   Old EKG Reviewed: changes noted  Her progression is new from February21, 2013 ED Course  Procedures (including critical care time) 8:20 AM appears more calm this feels more calm after treatment with Ativan, however states chest pain is not improved. Additional Ativan ordered Labs Reviewed - No data to display No results found. 905 resting comfortably , sleeping easily arousable, feels improved  No diagnosis found.  Results for orders placed during the hospital encounter of 02/08/12  CBC      Component Value Range   WBC 8.9  4.0 - 10.5 (K/uL)   RBC 5.57  4.22 - 5.81 (MIL/uL)   Hemoglobin 14.6  13.0 - 17.0 (g/dL)   HCT 95.6  21.3 - 08.6 (%)   MCV 78.5  78.0 - 100.0 (fL)   MCH 26.2  26.0 - 34.0 (pg)   MCHC 33.4  30.0 - 36.0 (g/dL)   RDW 57.8 (*) 46.9 - 15.5 (%)   Platelets 243  150 - 400 (K/uL)  DIFFERENTIAL      Component Value Range   Neutrophils Relative 67  43 - 77 (%)   Neutro Abs 6.0  1.7 - 7.7 (K/uL)   Lymphocytes Relative 21  12 - 46 (%)   Lymphs Abs 1.9  0.7 - 4.0 (K/uL)   Monocytes Relative 10  3 - 12 (%)   Monocytes Absolute 0.9  0.1 - 1.0 (K/uL)   Eosinophils Relative 2  0 - 5 (%)   Eosinophils Absolute 0.2  0.0 - 0.7 (K/uL)   Basophils Relative 1  0 - 1 (%)   Basophils Absolute 0.0  0.0 - 0.1 (K/uL)  POCT I-STAT, CHEM 8      Component Value Range   Sodium 142  135 - 145 (mEq/L)    Potassium 3.7  3.5 - 5.1 (mEq/L)   Chloride 108  96 - 112 (mEq/L)   BUN 7  6 - 23 (mg/dL)   Creatinine, Ser 6.29  0.50 - 1.35 (mg/dL)   Glucose, Bld 98  70 - 99 (mg/dL)  Calcium, Ion 1.13  1.12 - 1.32 (mmol/L)   TCO2 23  0 - 100 (mmol/L)   Hemoglobin 16.0  13.0 - 17.0 (g/dL)   HCT 30.8  65.7 - 84.6 (%)  PROTIME-INR      Component Value Range   Prothrombin Time 17.2 (*) 11.6 - 15.2 (seconds)   INR 1.38  0.00 - 1.49   POCT I-STAT TROPONIN I      Component Value Range   Troponin i, poc 0.00  0.00 - 0.08 (ng/mL)   Comment 3            Dg Chest Portable 1 View  02/08/2012  *RADIOLOGY REPORT*  Clinical Data: History of chest pain.  History of hypertension.  PORTABLE CHEST - 1 VIEW  Comparison: 11/22/2011.  Findings: Cardiac silhouette is upper range normal size.  It is normal shape.  Mediastinal and hilar contours appear stable.  No pulmonary infiltrates or nodules were evident. No pleural abnormality is evident. Bones appear average for age.  IMPRESSION: No acute or active cardiopulmonary or pleural abnormalities are evident.  Original Report Authenticated By: Crawford Givens, M.D.     MDM  Ativan ordered to treat chest pain as opposed to nitroglycerin in light of patient's recent cocaine use. [facility]  Cardiology called to evaluate patient for admission Plan admit step down unit Dx #1 chest pain  #2 cocaine abuse CRITICAL CARE Performed by: Doug Sou   Total critical care time: 30 minute  Critical care time was exclusive of separately billable procedures and treating other patients.  Critical care was necessary to treat or prevent imminent or life-threatening deterioration.  Critical care was time spent personally by me on the following activities: development of treatment plan with patient and/or surrogate as well as nursing, discussions with consultants, evaluation of patient's response to treatment, examination of patient, obtaining history from patient or surrogate, ordering  and performing treatments and interventions, ordering and review of laboratory studies, ordering and review of radiographic studies, pulse oximetry and re-evaluation of patient's condition.       Doug Sou, MD 02/08/12 1047

## 2012-02-08 NOTE — ED Notes (Signed)
Attempted to call report, sec st's nurse is in report

## 2012-02-08 NOTE — ED Notes (Signed)
Reminded Patient that we where waiting on a urine sample.  Patient tried to use a urinal but was unable to urinate, stated he needed more time.[name]

## 2012-02-08 NOTE — ED Notes (Signed)
Requested urine sample from patient.  Patient states he just voided a few minutes ago and didn't save a sample.  Specimen cup supplied.

## 2012-02-08 NOTE — ED Notes (Signed)
Pt woke up suddenly with cp pressure radiating up and out  Across chest, rates 10/10 with no releif with ntg or asa given by ems. Pt extremelty anxious per ems but now is more calm, pt sts increase in overall stress but can not pinpoint a specific cause of stresses. Hx of mi and sts feels similar

## 2012-02-08 NOTE — ED Notes (Signed)
Meal tray ordered 

## 2012-02-08 NOTE — ED Notes (Signed)
Awaiting on md for orders.

## 2012-02-09 DIAGNOSIS — F1410 Cocaine abuse, uncomplicated: Secondary | ICD-10-CM | POA: Diagnosis present

## 2012-02-09 DIAGNOSIS — E785 Hyperlipidemia, unspecified: Secondary | ICD-10-CM | POA: Diagnosis present

## 2012-02-09 LAB — BASIC METABOLIC PANEL
BUN: 10 mg/dL (ref 6–23)
Creatinine, Ser: 1.02 mg/dL (ref 0.50–1.35)
GFR calc Af Amer: >90 mL/min (ref >90)
GFR calc non Af Amer: 88 mL/min — ABNORMAL LOW (ref >90)
Potassium: 3.8 mEq/L (ref 3.5–5.1)

## 2012-02-09 LAB — LIPID PANEL
Cholesterol: 122 mg/dL (ref 0–200)
LDL Cholesterol: 30 mg/dL (ref 0–99)
VLDL: 55 mg/dL — ABNORMAL HIGH (ref 0–40)

## 2012-02-09 LAB — CBC
HCT: 40.4 % (ref 39.0–52.0)
MCHC: 33.2 g/dL (ref 30.0–36.0)
RDW: 15.6 % — ABNORMAL HIGH (ref 11.5–15.5)

## 2012-02-09 LAB — CARDIAC PANEL(CRET KIN+CKTOT+MB+TROPI)
Relative Index: 2.1 (ref 0.0–2.5)
Total CK: 114 U/L (ref 7–232)

## 2012-02-09 MED ORDER — ALUM & MAG HYDROXIDE-SIMETH 200-200-20 MG/5ML PO SUSP
30.0000 mL | Freq: Four times a day (QID) | ORAL | Status: DC | PRN
  Administered 2012-02-09: 30 mL via ORAL
  Filled 2012-02-09: qty 30

## 2012-02-09 MED ORDER — SERTRALINE HCL 25 MG PO TABS
25.0000 mg | ORAL_TABLET | Freq: Every day | ORAL | Status: DC
  Filled 2012-02-09: qty 1

## 2012-02-09 MED ORDER — LORAZEPAM 0.5 MG PO TABS
1.0000 mg | ORAL_TABLET | Freq: Four times a day (QID) | ORAL | Status: DC | PRN
  Administered 2012-02-10 (×2): 1 mg via ORAL
  Filled 2012-02-09 (×2): qty 2

## 2012-02-09 MED ORDER — WARFARIN SODIUM 10 MG PO TABS
10.0000 mg | ORAL_TABLET | Freq: Once | ORAL | Status: AC
  Administered 2012-02-09: 10 mg via ORAL
  Filled 2012-02-09: qty 1

## 2012-02-09 MED ORDER — PAROXETINE HCL 20 MG PO TABS
20.0000 mg | ORAL_TABLET | Freq: Every day | ORAL | Status: DC
  Filled 2012-02-09 (×2): qty 1

## 2012-02-09 NOTE — Progress Notes (Signed)
  Echocardiogram 2D Echocardiogram has been performed.  [name] 02/09/2012, 3:08 PM

## 2012-02-09 NOTE — Progress Notes (Addendum)
Pt. Seen and examined. Agree with the NP/PA-C note as written. He is really describing anxiety .Marland Kitchen Chest pressure but due to "racing thoughts".  INR is subtherapeutic and he has a history of LV thrombus with a probably hypercoagulable disorder. Troponins have been negative, despite recent cocaine use.  I would consider starting an anti-anxiety medication - he seems to be dealing with the anxiety by using cocaine. We discussed how dangerous this is with his history of coronary spasm. He also has a history of PTSD. Zoloft may be a good choice - start 25 mg daily for 1 week, then increase to 50 mg daily.  Plan contrast echo today to re-evaluate clot. May be okay for discharge later today.  Chrystie Nose, MD, [facility] Attending Cardiologist [facility]

## 2012-02-09 NOTE — Progress Notes (Signed)
ANTICOAGULATION CONSULT NOTE - Follow Up Consult  Pharmacy Consult for Coumadin Indication: Apical Thrombus  No Known Allergies  Patient Measurements: Height: 5\' 10"  (177.8 cm) Weight: 185 lb 6.5 oz (84.1 kg) IBW/kg (Calculated) : 73   Vital Signs: Temp: 97.7 F (36.5 C) (05/11 0815) Temp src: Oral (05/11 0815) BP: 131/84 mmHg (05/11 0815) Pulse Rate: 74  (05/11 0815)  Labs:  Basename 02/09/12 0145 02/09/12 0136 02/08/12 2005 02/08/12 1420 02/08/12 0837 02/08/12 0826 02/08/12 0816  HGB 13.4 -- -- -- -- 16.0 --  HCT 40.4 -- -- -- -- 47.0 43.7  PLT 238 -- -- -- -- -- 243  APTT -- -- -- -- -- -- --  LABPROT 18.3* -- -- -- 17.2* -- --  INR 1.49 -- -- -- 1.38 -- --  HEPARINUNFRC -- -- -- -- -- -- --  CREATININE 1.02 -- -- -- -- 1.00 --  CKTOTAL -- 114 121 129 -- -- --  CKMB -- 2.4 2.5 2.9 -- -- --  TROPONINI -- <0.30 <0.30 <0.30 -- -- --    Estimated Creatinine Clearance: 96.4 ml/min (by C-G formula based on Cr of 1.02).   Medications:  Scheduled:    . aspirin      . aspirin EC  81 mg Oral Daily  . atorvastatin  20 mg Oral q1800  . enoxaparin (LOVENOX) injection  80 mg Subcutaneous Once  . enoxaparin (LOVENOX) injection  80 mg Subcutaneous Q12H  . nitroGLYCERIN  1 inch Topical Q6H  . pantoprazole  40 mg Oral Q0600  . sertraline  25 mg Oral Daily  . warfarin  10 mg Oral ONCE-1800  . warfarin  10 mg Oral ONCE-1800  . Warfarin - Pharmacist Dosing Inpatient   Does not apply q1800  . DISCONTD: aspirin  81 mg Oral Daily  . DISCONTD: enoxaparin (LOVENOX) injection  80 mg Subcutaneous Q12H   Infusions:    Assessment: 46 y.o. male admitted 02/08/2012 with chest pain. Pharmacy consulted to dose lovenox and warfarin for history of apical thrombus  Warfarin PTA: 10 mg MWF, 7.5 mg other days of the week  INR subtherapetuic @ 1.49. Bridging with lovenox. Likely home today  Goal of Therapy:  INR 2-3 Anti-Xa level 0.6-1.2 units/ml 4hrs after LMWH dose given Monitor  platelets by anticoagulation protocol: Yes   Plan:  1) Coumadin 10 mg x 1 today 2) F/U INR in am if patient not discharged  Janace Litten, PharmD [phone] 02/09/2012,11:38 AM

## 2012-02-09 NOTE — Progress Notes (Signed)
[facility]  Subjective: CP resolved  Objective: Vital signs in last 24 hours: Temp:  [97.7 F (36.5 C)-98.7 F (37.1 C)] 97.7 F (36.5 C) (05/11 0815) Pulse Rate:  [68-101] 74  (05/11 0815) Resp:  [12-24] 13  (05/11 0815) BP: (81-131)/(41-84) 131/84 mmHg (05/11 0815) SpO2:  [91 %-100 %] 94 % (05/11 0815) Weight:  [84.1 kg (185 lb 6.5 oz)] 84.1 kg (185 lb 6.5 oz) (05/11 0448) Last BM Date: 02/08/12  Intake/Output from previous day: 05/10 0701 - 05/11 0700 In: 504 [P.O.:504] Out: 500 [Urine:500] Intake/Output this shift:    Medications Current Facility-Administered Medications  Medication Dose Route Frequency Provider Last Rate Last Dose  . acetaminophen (TYLENOL) tablet 650 mg  650 mg Oral Q4H PRN Dwana Melena, PA   650 mg at 02/08/12 2145  . aspirin 81 MG chewable tablet        81 mg at 02/08/12 1604  . aspirin EC tablet 81 mg  81 mg Oral Daily Mihai Croitoru, MD      . atorvastatin (LIPITOR) tablet 20 mg  20 mg Oral q1800 Dwana Melena, PA   20 mg at 02/08/12 2311  . enoxaparin (LOVENOX) injection 80 mg  80 mg Subcutaneous Once Doug Sou, MD   80 mg at 02/08/12 1608  . enoxaparin (LOVENOX) injection 80 mg  80 mg Subcutaneous Q12H Mihai Croitoru, MD   80 mg at 02/09/12 0441  . LORazepam (ATIVAN) tablet 1 mg  1 mg Oral Q8H PRN Dwana Melena, PA   1 mg at 02/09/12 0022  . nitroGLYCERIN (NITROGLYN) 2 % ointment 1 inch  1 inch Topical Q6H Dwana Melena, PA   1 inch at 02/09/12 [phone]  . ondansetron (ZOFRAN) injection 4 mg  4 mg Intravenous Q6H PRN Dwana Melena, PA      . pantoprazole (PROTONIX) EC tablet 40 mg  40 mg Oral Q0600 Dwana Melena, PA   40 mg at 02/09/12 0718  . warfarin (COUMADIN) tablet 10 mg  10 mg Oral ONCE-1800 Doug Sou, MD   10 mg at 02/08/12 2311  . Warfarin - Pharmacist Dosing Inpatient   Does not apply [id number] Doug Sou, MD      . DISCONTD: aspirin EC tablet 81 mg  81 mg Oral Daily Dwana Melena, PA      .  DISCONTD: enoxaparin (LOVENOX) injection 80 mg  80 mg Subcutaneous Q12H Doug Sou, MD        PE: General appearance: alert, cooperative and no distress Lungs: clear to auscultation bilaterally Heart: regular rate and rhythm, S1, S2 normal, no murmur, click, rub or gallop Extremities: No LEE Pulses: Radials 2+ and symmetric. Lower extremities warm.  Lab Results:   Basename 02/09/12 0145 02/08/12 0826 02/08/12 0816  WBC 8.3 -- 8.9  HGB 13.4 16.0 14.6  HCT 40.4 47.0 43.7  PLT 238 -- 243   BMET  Basename 02/09/12 0145 02/08/12 0826  NA 141 142  K 3.8 3.7  CL 105 108  CO2 28 --  GLUCOSE 88 98  BUN 10 7  CREATININE 1.02 1.00  CALCIUM 8.4 --   PT/INR  Basename 02/09/12 0145 02/08/12 0837  LABPROT 18.3* 17.2*  INR 1.49 1.38   Cholesterol  Basename 02/09/12 0145  CHOL 122   Cardiac Enzymes CK, MB 2.9 2.5 2.4  Total CK 129 121 114  Troponin I <0.30  <0.30  <0.30  Cholesterol 122 Triglycerides 276 HDL 37 LDL Cholesterol 30  VLDL 55 Total CHOL/HDL Ratio 3.3  Assessment/Plan  Principal Problem:  *CHEST PAIN Active Problems:  TOBACCO ABUSE  POST TRAUMATIC STRESS SYNDROME  PVD WITH CLAUDICATION  GERD  CAD (coronary artery disease)  Anxiety  Dyslipidemia  Cocaine abuse   Plan:  CP resolved.  Cardiac enzymes negative.  UDS + for Cocaine.  No beta blocker. Likely anxiety driven.  Drug abuse and tobacco abuse consult pending.  BP today: 111/63 - 131/84  HR controlled.   ASA/statin/coumadin.  INR subtherpeutic.  1.49.   DC home today likely.   LOS: 1 day    Daffney Greenly W 02/09/2012 9:50 AM

## 2012-02-10 LAB — PROTIME-INR: INR: 1.56 — ABNORMAL HIGH (ref 0.00–1.49)

## 2012-02-10 MED ORDER — LORAZEPAM 1 MG PO TABS: 0.5000 mg | ORAL_TABLET | Freq: Three times a day (TID) | ORAL | Status: AC | PRN

## 2012-02-10 MED ORDER — ATORVASTATIN CALCIUM 20 MG PO TABS: 20.0000 mg | ORAL_TABLET | Freq: Every day | ORAL | Status: DC

## 2012-02-10 NOTE — Progress Notes (Signed)
Pt education completed and pt denying any questions.  Pt 's IV discontinued.  Pt wheeled out in wheelchair and left with his sister who was going to drive him directly to [facility].  Pt left hospital with no s/s of distress and denied any pain.

## 2012-02-10 NOTE — Discharge Instructions (Signed)
Chest Pain (Nonspecific) Chest pain has many causes. Your pain could be caused by something serious, such as a heart attack or a blood clot in the lungs. It could also be caused by something less serious, such as a chest bruise or a virus. Follow up with your doctor. More lab tests or other studies may be needed to find the cause of your pain. Most of the time, nonspecific chest pain will improve within 2 to 3 days of rest and mild pain medicine. HOME CARE  For chest bruises, you may put ice on the sore area for 15 to 20 minutes, 3 to 4 times a day. Do this only if it makes you or your child feel better.   Put ice in a plastic bag.   Place a towel between the skin and the bag.   Rest for the next 2 to 3 days.   Go back to work if the pain improves.   See your doctor if the pain lasts longer than 1 to 2 weeks.   Only take medicine as told by your doctor.   Quit smoking if you smoke.  GET HELP RIGHT AWAY IF:   There is more pain or pain that spreads to the arm, neck, jaw, back, or belly (abdomen).   You or your child has shortness of breath.   You or your child coughs more than usual or coughs up blood.   You or your child has very bad back or belly pain, feels sick to his or her stomach (nauseous), or throws up (vomits).   You or your child has very bad weakness.   You or your child passes out (faints).   You or your child has a temperature by mouth above 102 F (38.9 C), not controlled by medicine.  Any of these problems may be serious and may be an emergency. Do not wait to see if the problems will go away. Get medical help right away. Call your local emergency services 911 in [place] Do not drive yourself to the hospital. MAKE SURE YOU:   Understand these instructions.   Will watch this condition.   Will get help right away if you or your child is not doing well or gets worse.  Document Released: 03/05/2008 Document Revised: 09/06/2011 Document Reviewed:  03/05/2008 Exit[employer] Patient Information 2012 ExitCare, LLCChest Pain (Nonspecific) Chest pain has many causes. Your pain could be caused by something serious, such as a heart attack or a blood clot in the lungs. It could also be caused by something less serious, such as a chest bruise or a virus. Follow up with your doctor. More lab tests or other studies may be needed to find the cause of your pain. Most of the time, nonspecific chest pain will improve within 2 to 3 days of rest and mild pain medicine. HOME CARE  For chest bruises, you may put ice on the sore area for 15 to 20 minutes, 3 to 4 times a day. Do this only if it makes you or your child feel better.   Put ice in a plastic bag.   Place a towel between the skin and the bag.   Rest for the next 2 to 3 days.   Go back to work if the pain improves.   See your doctor if the pain lasts longer than 1 to 2 weeks.   Only take medicine as told by your doctor.   Quit smoking if you smoke.  GET HELP RIGHT AWAY IF:  There is more pain or pain that spreads to the arm, neck, jaw, back, or belly (abdomen).   You or your child has shortness of breath.   You or your child coughs more than usual or coughs up blood.   You or your child has very bad back or belly pain, feels sick to his or her stomach (nauseous), or throws up (vomits).   You or your child has very bad weakness.   You or your child passes out (faints).   You or your child has a temperature by mouth above 102 F (38.9 C), not controlled by medicine.  Any of these problems may be serious and may be an emergency. Do not wait to see if the problems will go away. Get medical help right away. Call your local emergency services 911 in [place] Do not drive yourself to the hospital. MAKE SURE YOU:   Understand these instructions.   Will watch this condition.   Will get help right away if you or your child is not doing well or gets worse.  Document Released: 03/05/2008  Document Revised: 09/06/2011 Document Reviewed: 03/05/2008 Exit[employer] Patient Information 2012 ExitCare, LLCChest Pain Observation It is often hard to give a specific diagnosis for the cause of chest pain. Your symptoms had a chance of being caused by inadequate oxygen delivery to your heart (angina). Angina that is not treated or evaluated can lead to a heart attack (myocardial infarction, MI) or death. Blood tests, electrocardiograms, and X-rays may have been done to help determine a possible cause of your chest pain. After evaluation and observation, your caregiver has determined that it is unlikely your pain was caused by angina. However, a full evaluation of your pain needs to be completed. You need to follow up with caregivers or diagnostic testing as directed. It is very important to keep your follow-up appointments. Not keeping your follow-up appointments could result in permanent heart damage, disability, or death. If there is any problem keeping your follow-up appointments, you must call your caregiver. HOME CARE INSTRUCTIONS  Due to the slight chance that your pain could be angina, it is important to follow healthy lifestyle habits and follow your caregiver's treatment plan:  Maintain a healthy weight.   Stay physically active and exercise regularly.   Decrease your salt intake.   Eat a diet low in saturated fats and cholesterol. Avoid foods fried in oil or made with fat. Talk to a dietician to learn about heart healthy foods.   Increase your fiber intake by including whole grains, vegetable, and fruits in your diet.   Avoid situations that cause stress, anger, or depression.   Take medication as advised by your caregiver. Report any side effects to your caregiver. Do not stop medications or adjust the dosages on your own.   Quit smoking. Do not use nicotine patches or gum until you check with your caregiver.   Keep your blood pressure, blood sugar, and cholesterol levels within  normal limits.   Limit alcohol intake to no more than 1 drink per day for nonpregnant women and 2 drinks per day for men.   Stop abusing drugs.  SEEK MEDICAL CARE IF: You have severe chest pain or pressure which may include symptoms such as:  Pain or pressure in the arms, neck, jaw, or back.   Profuse sweating.   Feeling sick to your stomach (nauseous).   Feeling short of breath while at rest.   Having a fast or irregular heartbeat.   You have  chest pain that does not get better after rest or after taking your usual medicine.   You wake from sleep with chest pain.   You feel dizzy, faint, or experience extreme fatigue.   You notice increasing shortness of breath during rest, sleep, or with activity.   You are unable to sleep because you cannot breathe.   You develop a frequent cough or you are coughing up blood.   You have severe back or abdominal pain, are nauseated, or throw up (vomit).   You develop severe weakness, dizziness, fainting, or chills.  Any of these symptoms may represent a serious problem that is an emergency. Do not wait to see if the symptoms will go away. Call your local emergency services (911 in the [place] Do not drive yourself to the hospital. MAKE SURE YOU:  Understand these instructions.   Will watch your condition.   Will get help right away if you are not doing well or get worse.  Document Released: 10/20/2010 Document Revised: 09/06/2011 Document Reviewed: 10/20/2010 [facility] Patient Information 2012 [place], [place].Chest Pain Observation It is often hard to give a specific diagnosis for the cause of chest pain. Your symptoms had a chance of being caused by inadequate oxygen delivery to your heart (angina). Angina that is not treated or evaluated can lead to a heart attack (myocardial infarction, MI) or death. Blood tests, electrocardiograms, and X-rays may have been done to help determine a possible cause of your chest pain. After evaluation and  observation, your caregiver has determined that it is unlikely your pain was caused by angina. However, a full evaluation of your pain needs to be completed. You need to follow up with caregivers or diagnostic testing as directed. It is very important to keep your follow-up appointments. Not keeping your follow-up appointments could result in permanent heart damage, disability, or death. If there is any problem keeping your follow-up appointments, you must call your caregiver. HOME CARE INSTRUCTIONS  Due to the slight chance that your pain could be angina, it is important to follow healthy lifestyle habits and follow your caregiver's treatment plan:  Maintain a healthy weight.   Stay physically active and exercise regularly.   Decrease your salt intake.   Eat a diet low in saturated fats and cholesterol. Avoid foods fried in oil or made with fat. Talk to a dietician to learn about heart healthy foods.   Increase your fiber intake by including whole grains, vegetable, and fruits in your diet.   Avoid situations that cause stress, anger, or depression.   Take medication as advised by your caregiver. Report any side effects to your caregiver. Do not stop medications or adjust the dosages on your own.   Quit smoking. Do not use nicotine patches or gum until you check with your caregiver.   Keep your blood pressure, blood sugar, and cholesterol levels within normal limits.   Limit alcohol intake to no more than 1 drink per day for nonpregnant women and 2 drinks per day for men.   Stop abusing drugs.  SEEK MEDICAL CARE IF: You have severe chest pain or pressure which may include symptoms such as:  Pain or pressure in the arms, neck, jaw, or back.   Profuse sweating.   Feeling sick to your stomach (nauseous).   Feeling short of breath while at rest.   Having a fast or irregular heartbeat.   You have chest pain that does not get better after rest or after taking your usual medicine.  You wake from sleep with chest pain.   You feel dizzy, faint, or experience extreme fatigue.   You notice increasing shortness of breath during rest, sleep, or with activity.   You are unable to sleep because you cannot breathe.   You develop a frequent cough or you are coughing up blood.   You have severe back or abdominal pain, are nauseated, or throw up (vomit).   You develop severe weakness, dizziness, fainting, or chills.  Any of these symptoms may represent a serious problem that is an emergency. Do not wait to see if the symptoms will go away. Call your local emergency services (911 in the [place] Do not drive yourself to the hospital. MAKE SURE YOU:  Understand these instructions.   Will watch your condition.   Will get help right away if you are not doing well or get worse.  Document Released: 10/20/2010 Document Revised: 09/06/2011 Document Reviewed: 10/20/2010 [facility] Patient Information 2012 [place], [place].Chest Pain Observation It is often hard to give a specific diagnosis for the cause of chest pain. Your symptoms had a chance of being caused by inadequate oxygen delivery to your heart (angina). Angina that is not treated or evaluated can lead to a heart attack (myocardial infarction, MI) or death. Blood tests, electrocardiograms, and X-rays may have been done to help determine a possible cause of your chest pain. After evaluation and observation, your caregiver has determined that it is unlikely your pain was caused by angina. However, a full evaluation of your pain needs to be completed. You need to follow up with caregivers or diagnostic testing as directed. It is very important to keep your follow-up appointments. Not keeping your follow-up appointments could result in permanent heart damage, disability, or death. If there is any problem keeping your follow-up appointments, you must call your caregiver. HOME CARE INSTRUCTIONS  Due to the slight chance that your pain  could be angina, it is important to follow healthy lifestyle habits and follow your caregiver's treatment plan:  Maintain a healthy weight.   Stay physically active and exercise regularly.   Decrease your salt intake.   Eat a diet low in saturated fats and cholesterol. Avoid foods fried in oil or made with fat. Talk to a dietician to learn about heart healthy foods.   Increase your fiber intake by including whole grains, vegetable, and fruits in your diet.   Avoid situations that cause stress, anger, or depression.   Take medication as advised by your caregiver. Report any side effects to your caregiver. Do not stop medications or adjust the dosages on your own.   Quit smoking. Do not use nicotine patches or gum until you check with your caregiver.   Keep your blood pressure, blood sugar, and cholesterol levels within normal limits.   Limit alcohol intake to no more than 1 drink per day for nonpregnant women and 2 drinks per day for men.   Stop abusing drugs.  SEEK MEDICAL CARE IF: You have severe chest pain or pressure which may include symptoms such as:  Pain or pressure in the arms, neck, jaw, or back.   Profuse sweating.   Feeling sick to your stomach (nauseous).   Feeling short of breath while at rest.   Having a fast or irregular heartbeat.   You have chest pain that does not get better after rest or after taking your usual medicine.   You wake from sleep with chest pain.   You feel dizzy, faint, or experience extreme  fatigue.   You notice increasing shortness of breath during rest, sleep, or with activity.   You are unable to sleep because you cannot breathe.   You develop a frequent cough or you are coughing up blood.   You have severe back or abdominal pain, are nauseated, or throw up (vomit).   You develop severe weakness, dizziness, fainting, or chills.  Any of these symptoms may represent a serious problem that is an emergency. Do not wait to see if the  symptoms will go away. Call your local emergency services (911 in the [place] Do not drive yourself to the hospital. MAKE SURE YOU:  Understand these instructions.   Will watch your condition.   Will get help right away if you are not doing well or get worse.  Document Released: 10/20/2010 Document Revised: 09/06/2011 Document Reviewed: 10/20/2010 [facility] Patient Information 2012 [place], [place].Chest Pain Observation It is often hard to give a specific diagnosis for the cause of chest pain. Your symptoms had a chance of being caused by inadequate oxygen delivery to your heart (angina). Angina that is not treated or evaluated can lead to a heart attack (myocardial infarction, MI) or death. Blood tests, electrocardiograms, and X-rays may have been done to help determine a possible cause of your chest pain. After evaluation and observation, your caregiver has determined that it is unlikely your pain was caused by angina. However, a full evaluation of your pain needs to be completed. You need to follow up with caregivers or diagnostic testing as directed. It is very important to keep your follow-up appointments. Not keeping your follow-up appointments could result in permanent heart damage, disability, or death. If there is any problem keeping your follow-up appointments, you must call your caregiver. HOME CARE INSTRUCTIONS  Due to the slight chance that your pain could be angina, it is important to follow healthy lifestyle habits and follow your caregiver's treatment plan:  Maintain a healthy weight.   Stay physically active and exercise regularly.   Decrease your salt intake.   Eat a diet low in saturated fats and cholesterol. Avoid foods fried in oil or made with fat. Talk to a dietician to learn about heart healthy foods.   Increase your fiber intake by including whole grains, vegetable, and fruits in your diet.   Avoid situations that cause stress, anger, or depression.   Take medication  as advised by your caregiver. Report any side effects to your caregiver. Do not stop medications or adjust the dosages on your own.   Quit smoking. Do not use nicotine patches or gum until you check with your caregiver.   Keep your blood pressure, blood sugar, and cholesterol levels within normal limits.   Limit alcohol intake to no more than 1 drink per day for nonpregnant women and 2 drinks per day for men.   Stop abusing drugs.  SEEK MEDICAL CARE IF: You have severe chest pain or pressure which may include symptoms such as:  Pain or pressure in the arms, neck, jaw, or back.   Profuse sweating.   Feeling sick to your stomach (nauseous).   Feeling short of breath while at rest.   Having a fast or irregular heartbeat.   You have chest pain that does not get better after rest or after taking your usual medicine.   You wake from sleep with chest pain.   You feel dizzy, faint, or experience extreme fatigue.   You notice increasing shortness of breath during rest, sleep, or with activity.  You are unable to sleep because you cannot breathe.   You develop a frequent cough or you are coughing up blood.   You have severe back or abdominal pain, are nauseated, or throw up (vomit).   You develop severe weakness, dizziness, fainting, or chills.  Any of these symptoms may represent a serious problem that is an emergency. Do not wait to see if the symptoms will go away. Call your local emergency services (911 in the [place] Do not drive yourself to the hospital. MAKE SURE YOU:  Understand these instructions.   Will watch your condition.   Will get help right away if you are not doing well or get worse.  Document Released: 10/20/2010 Document Revised: 09/06/2011 Document Reviewed: 10/20/2010 [facility] Patient Information 2012 [place], [place].Marland KitchenMarland Kitchen

## 2012-02-10 NOTE — Progress Notes (Signed)
Pt. Seen and examined. Agree with the NP/PA-C note as written.  No evidence for ACS. Echo does not demonstrate LV thrombus. Would continue on warfarin for now, but could consider discontinuation at some point. Follow-up with Dr. Tresa Endo. He has significant anxiety and a PTSD history, but has refused SSRI's.  I have provided him with the telephone number for [facility].  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 D/C home today.  Chrystie Nose, MD, [facility] Attending Cardiologist [facility]

## 2012-02-10 NOTE — Discharge Summary (Signed)
Physician Discharge Summary  Patient ID: [name] MRN: [id number] DOB/AGE: 1966/01/24 46 y.o.  Admit date: 02/08/2012 Discharge date: 02/10/2012  Admission Diagnoses:  Chest Pain  Discharge Diagnoses:  Principal Problem:  *CHEST PAIN Active Problems:  Anxiety  TOBACCO ABUSE  POST TRAUMATIC STRESS SYNDROME  PVD WITH CLAUDICATION  GERD  CAD (coronary artery disease)  Dyslipidemia  Cocaine abuse   Discharged Condition: stable  Hospital Course:   The patient is a 46 year old Caucasian male with history of coronary artery disease previous STEMI 07/23/2011 was taken acutely to the cardiac cath lab which revealed evidence of abrupt distal occlusion of 3 distal branches of the circumflex coronary artery. These were small in caliber and not amenable to intervention. He was also found to have mild spasm in the right coronary artery at the mid segment which improved with IC Nitroglycerin. His initial ejection fraction was 50-55% he did have a focal distal inferior wall hypocontractility. His subsequent echocardiogram which suggested a mobile apical thrombus for which he was coumadinized. His history also includes cocaine abuse, bilateral lower extremity DVTs, anxiety, PTSD, depression, hyperlipidemia, peripheral artery disease, asthma, tobacco abuse.  He is a Water engineer.  Patient had thrombectomy of his left superficial femoral artery and left profunda femoral artery remotely. Patient last saw Dr. Tresa Endo on 08/15/2011 at which time he was taken off of Effient and continued on aspirin   The patient presented with a subtherapeutic INR and with complaints of chest pain. He describes the pain as "pressure-like" and it woke him up at 063 hours this morning. The pain was 10 out of 10 in intensity and currently is 5/10 in intensity. He had no radiation of the pain to his neck, arm, back, or jaw. He did say that he felt uncomfortable down into his stomach area. He also has had associated shortness of  breath and maybe a little diaphoresis. He denies nausea, vomiting, fever, palpitations, cough, congestion, orthopnea, lower extremity edema, abdominal pain, dysuria, hematuria, hematochezia, melena. Patient does report occasional "numbness in his stomach". He states that a period depend on his position and is intermittent. He just reports using cocaine approximately 3 days ago as well as being dizzy 3 days ago.  The patient was admitted to step down to rule out ACS.  His cardiac markers were negative times.  UDS was positive for cocaine.  He was restarted on coumadin.  Drug and tobacco abuse consults were requested.  The patient's chest pain was likely related to severe anxiety.  He refused both Zoloft and Paxil.  We discussed getting help at behavior health and provided him with the telephone number.  Echo revealed EF of 60-65% with no wall motion abnormality and no evidence of LV thrombus.  He was given a prescription for Ativan 0.5mg  with no refills.  He was discharged home in stable condition after being seen by Dr. Rennis Golden.  INR check will be Tuesday.  Consults: Tobacco/Drug abuse  Significant Diagnostic Studies:  2D Echo. Study Conclusions  - Left ventricle: The cavity size was normal. There was mild concentric hypertrophy. Systolic function was normal. The estimated ejection fraction was in the range of 60% to 65%. No evidence for LV apical thrombus with reasonable endocardial definition. Definity was not administered as ordered. Doppler parameters are consistent with abnormal left ventricular relaxation (grade 1 diastolic dysfunction). The E/e' ratio is <10, suggesting normal LV filling pressure. - Left atrium: The atrium was normal in size. Impressions:  - No evidence for LV thrombus  with reasonable endocardial definition. Definity was not administered, however, as ordered.   Treatments: Ativan, Coumadin  Discharge Exam: Blood pressure 94/61, pulse 68, temperature 97.8 F (36.6  C), temperature source Oral, resp. rate 16, height 5\' 10"  (1.778 m), weight 84.1 kg (185 lb 6.5 oz), SpO2 94.00%.   Disposition: 01-Home or Self Care  Discharge Orders    Future Orders Please Complete By Expires   Diet - low sodium heart healthy      Increase activity slowly        Medication List  As of 02/10/2012 10:35 AM   TAKE these medications         aspirin 81 MG EC tablet   Take 81 mg by mouth daily.      atorvastatin 20 MG tablet   Commonly known as: LIPITOR   Take 1 tablet (20 mg total) by mouth daily at 6 PM.      LORazepam 1 MG tablet   Commonly known as: ATIVAN   Take 0.5 tablets (0.5 mg total) by mouth every 8 (eight) hours as needed for anxiety.      warfarin 5 MG tablet   Commonly known as: COUMADIN   Take 7.5-10 mg by mouth daily. Take 2 tablets on Monday, Wednesday, Friday and then take 1.5 tablets all other days             Signed: Dwana Melena 02/10/2012, 10:35 AM

## 2012-02-10 NOTE — Discharge Summary (Signed)
[name] C. Tracey Stewart, MD, FACC Attending Cardiologist [facility]  

## 2012-02-10 NOTE — Plan of Care (Signed)
Problem: Consults Goal: Tobacco Cessation referral if indicated Outcome: Adequate for Discharge Pt refused

## 2012-02-10 NOTE — Progress Notes (Signed)
[facility]  Subjective: No complaints this AM.  Sleepy.  Objective: Vital signs in last 24 hours: Temp:  [97.8 F (36.6 C)-98 F (36.7 C)] 97.8 F (36.6 C) (05/12 0753) Pulse Rate:  [62-86] 63  (05/12 0753) Resp:  [13-19] 16  (05/12 0753) BP: (94-108)/(59-69) 94/61 mmHg (05/12 0753) SpO2:  [94 %-98 %] 94 % (05/12 0753) Last BM Date: 02/09/12  Intake/Output from previous day: 05/11 0701 - 05/12 0700 In: 920 [P.O.:920] Out: 1500 [Urine:1500] Intake/Output this shift:    Medications Current Facility-Administered Medications  Medication Dose Route Frequency Provider Last Rate Last Dose  . acetaminophen (TYLENOL) tablet 650 mg  650 mg Oral Q4H PRN Dwana Melena, PA   650 mg at 02/10/12 0651  . alum & mag hydroxide-simeth (MAALOX/MYLANTA) 200-200-20 MG/5ML suspension 30 mL  30 mL Oral Q6H PRN Dwana Melena, PA   30 mL at 02/09/12 1738  . aspirin EC tablet 81 mg  81 mg Oral Daily Mihai Croitoru, MD   81 mg at 02/09/12 1005  . atorvastatin (LIPITOR) tablet 20 mg  20 mg Oral q1800 Dwana Melena, PA   20 mg at 02/09/12 1743  . enoxaparin (LOVENOX) injection 80 mg  80 mg Subcutaneous Q12H Mihai Croitoru, MD   80 mg at 02/10/12 1610  . LORazepam (ATIVAN) tablet 1 mg  1 mg Oral Q6H PRN Chrystie Nose, MD   1 mg at 02/10/12 0651  . nitroGLYCERIN (NITROGLYN) 2 % ointment 1 inch  1 inch Topical Q6H Dwana Melena, PA   1 inch at 02/10/12 [phone]  . ondansetron (ZOFRAN) injection 4 mg  4 mg Intravenous Q6H PRN Dwana Melena, PA      . pantoprazole (PROTONIX) EC tablet 40 mg  40 mg Oral Q0600 Dwana Melena, PA   40 mg at 02/09/12 0718  . PARoxetine (PAXIL) tablet 20 mg  20 mg Oral Daily Dwana Melena, PA      . warfarin (COUMADIN) tablet 10 mg  10 mg Oral ONCE-1800 Burlene Arnt Elder, PHARMD   10 mg at 02/09/12 1744  . Warfarin - Pharmacist Dosing Inpatient   Does not apply [id number] Doug Sou, MD      . DISCONTD: LORazepam (ATIVAN) tablet 1 mg  1 mg Oral Q8H PRN  Dwana Melena, PA   1 mg at 02/09/12 1816  . DISCONTD: sertraline (ZOLOFT) tablet 25 mg  25 mg Oral Daily Chrystie Nose, MD        PE: General appearance: alert, cooperative and no distress Lungs: clear to auscultation bilaterally Heart: regular rate and rhythm, S1, S2 normal, no murmur, click, rub or gallop Extremities: No LEE Pulses: 2+ and symmetric  Lab Results:   Basename 02/09/12 0145 02/08/12 0826 02/08/12 0816  WBC 8.3 -- 8.9  HGB 13.4 16.0 14.6  HCT 40.4 47.0 43.7  PLT 238 -- 243   BMET  Basename 02/09/12 0145 02/08/12 0826  NA 141 142  K 3.8 3.7  CL 105 108  CO2 28 --  GLUCOSE 88 98  BUN 10 7  CREATININE 1.02 1.00  CALCIUM 8.4 --   PT/INR  Basename 02/10/12 0346 02/09/12 0145 02/08/12 0837  LABPROT 19.0* 18.3* 17.2*  INR 1.56* 1.49 1.38   Cholesterol  Basename 02/09/12 0145  CHOL 122   Studies/Results:  2D Echo, Study Conclusions  - Left ventricle: The cavity size was normal. There was mild concentric hypertrophy. Systolic function was normal. The estimated  ejection fraction was in the range of 60% to 65%. No evidence for LV apical thrombus with reasonable endocardial definition. Definity was not administered as ordered. Doppler parameters are consistent with abnormal left ventricular relaxation (grade 1 diastolic dysfunction). The E/e' ratio is <10, suggesting normal LV filling pressure. - Left atrium: The atrium was normal in size. Impressions:  - No evidence for LV thrombus with reasonable endocardial definition. Definity was not administered, however, as ordered.  Assessment/Plan   Principal Problem:  *CHEST PAIN Active Problems:  Anxiety  TOBACCO ABUSE  POST TRAUMATIC STRESS SYNDROME  PVD WITH CLAUDICATION  GERD  CAD (coronary artery disease)  Dyslipidemia  Cocaine abuse  Plan:  Cardiac markers negative times three.  INR subtherapeutic but increasing.  Will Schedule INR check.  Recommended psychologist evaluation.  Will  provide number for Behavior Health.  Also,l will prescribe short term Ativan.  Echo revealed EF of 60-65% with no wall motion abnormality and no evidence of LV thrombus.   BP stable: 102/59 - 131/84.  DC home today.    LOS: 2 days    Eilis Chestnutt W 02/10/2012 8:33 AM

## 2012-05-02 ENCOUNTER — Ambulatory Visit (INDEPENDENT_AMBULATORY_CARE_PROVIDER_SITE_OTHER): Payer: Medicare Other | Admitting: Psychiatry

## 2012-05-02 ENCOUNTER — Encounter (HOSPITAL_COMMUNITY): Payer: Self-pay | Admitting: Psychiatry

## 2012-05-02 VITALS — BP 120/90 | HR 72 | Wt 206.0 lb

## 2012-05-02 DIAGNOSIS — F1010 Alcohol abuse, uncomplicated: Secondary | ICD-10-CM

## 2012-05-02 DIAGNOSIS — F1410 Cocaine abuse, uncomplicated: Secondary | ICD-10-CM

## 2012-05-02 DIAGNOSIS — F319 Bipolar disorder, unspecified: Secondary | ICD-10-CM

## 2012-05-02 MED ORDER — RISPERIDONE 1 MG PO TABS: ORAL_TABLET | ORAL | Status: DC

## 2012-05-02 MED ORDER — GABAPENTIN 300 MG PO CAPS: 300.0000 mg | ORAL_CAPSULE | Freq: Two times a day (BID) | ORAL | Status: DC

## 2012-05-02 NOTE — Progress Notes (Signed)
Chief complaint I am having panic attack.  I want my life back  History presenting illness Patient is 46 year old Caucasian unemployed single man who came for his appointment for seeking treatment and evaluation.  Patient is self-referred.  Patient endorse for past few months he having a lot of anxiety symptoms.  He complained poor sleep, easily irritable, and having panic attacks.  He has been to emergency room at least 5 times for anxiety.  He has multiple stressors in his life.  He is house arrest and cannot go anywhere without permission off probation officer.  Patient came out from jail last October after serving 8 months.  Patient told he was arrested for the crime that he never did it.  Patient told he was with his friend who was arrested for stolen property and his friend ran from police .  Patient was not aware that he has a stolen property in his car.  Last November patient has heart attack .  Patient feel some time that his life is not worth it but he denies any suicidal thinking.  He admitted poor concentration poor attention decreased energy level and difficulty controlling his anger.  He admitted drinking sixpacks every third day but denies any withdrawals or tremors.  He also admitted smoking cocaine on and off but claims to be sober since January this year.  Patient feel guilty about his self as he let down many people in his life.  He admitted that he has been isolated withdrawn and passive recent months.  He is frustrated due to house arrest .  He also admitted getting some time confuse and difficult to recall old evens.  He feels guilty that he has not seen his girlfriend and 2 children in past 3-1/2 years.  Patient told he left them without telling and spent 5 days at [facility] and when he returns the locks were changed.  His girlfriend took 83 B to protect herself and the children as patient threatened to shoot her with a gun.  Patient realized that all his life he has lot of issues with his  anger temper and mood swing.  He also admitted having drug and alcohol problem but now he feel more anxious and worried about his physical health.  He lives with his father however he admitted that he does not get along with him.  He admitted drinking alcohol to calm himself and sleep however he only sleeps 2-3 hours.  He also admitted hearing voices but people calling his name and feeling paranoid that people are talking about him.  He feel very scared at home and having panic attacks by living himself.  He denies any active or passive suicidal thoughts or homicidal thoughts.  Currently he is not taking any psychiatric medication.  He admitted to gain weight in past 2 months.   Past psychiatric history Patient admitted history of psychiatric illness and at least one [facility]  He was seeing Dr. Katrinka Blazing and remembered taking Prozac Zoloft Paxil Seroquel with limited response.  He also remembered taking lithium and Risperdal but do not remember the details.  Patient denies any previous history of suicidal attempt however endorse history of significant mood swing anger agitation and paranoia.    Psychosocial history Patient was born and raised in [place].  He has been married once however his marriage last only for 2 years .  Patient admitted his anger was an issue that his wife left him.  He has one daughter with his wife who is currently living with her.  Patient has girlfriend for 13 years and he has 2 children with her however 3 and half years ago he left her without telling her and when he returned the locks were changed.  Patient has not seen his children and girlfriend in past 3 years.  Patient currently lives with his father .  He has not seen his mother for a long time.    Family history Patient endorse sister has bipolar disorder.  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     Alcohol and substance use history Patient admitted history of using cocaine , marijuana and alcohol .  He admitted heavy alcohol use in his past.  He also remembered taking pain medication and heavy use when he has flexibility.  He stopped smoking marijuana on a regular basis .  He still drinks sixpacks every 3-4 days.  He denies a history of DTs, DWI or any withdrawals.    Education work history Patient has 2 years college .  Currently he is not working .  Medical history Patient has history of myocardial infarction , DVT, hyperlipidemia, peripheral artery disease, femoral bypass and cardiac catheterization.  His primary care physician is [employer] and on [facility].    Mental status examination Patient is casually dressed and fairly groomed.  He appears tense restless and tired.  He is guarded and maintained fair eye contact.  His speech is slow and at times decrease in volume .  He has difficulty concentrating his thoughts.  He denies any active or passive suicidal thoughts or homicidal thoughts.  He described his mood is anxious tired and his affect is mood appropriate.  He endorse auditory hallucination believing people calling his name and paranoid thinking that people are talking about him .  His attention and concentration is poor.  He has difficulty recalling names of medication and old evens.  His thought process is slow .  There is no flight of ideas or loose association .  His fund of knowledge is average.  He has no tremors or shakes.  His insight judgment is fair his impulse control is okay.  Assessment Axis I bipolar disorder , alcohol abuse , cocaine abuse in partial remission  Axis II deferred Axis III history of bicarbonate infarction, DVT, hyperlipidemia, peripheral artery disease, femoral bypass and cardiac catheterization.   Axis IV moderate  Axis V 50-60  Plan I talked to the patient in length about his symptoms.  Patient has significant mood symptoms along with anxiety  symptoms.  He continued to drink on regular basis.  He has significant psychosocial stressor including house arrest .  I recommend to try Neurontin 300 mg 2 times a day for anxiety and insomnia.  I will also start Risperdal 1 mg he can take half to one tablet for psychosis and paranoia.  I explain in detail the risks and benefits of medication including metabolic syndrome and extrapyramidal side effects.  I also talked in detail about continue use of alcohol, we discussed potential consequences including worsening of anxiety, cardiac effects and worsening of memory.  Patient acknowledged and agreed to cut down his drinking.  I also recommend to see therapist for coping and social skills.  We will schedule appointment with Delight Stare.  I will get collateral information from his primary care physician.  His last CBC and basic metabolic panel was normal and his urine  drug screen was positive for cocaine.  Patient is taking Coumadin for DVT.  I recommend to call us if he is a question or concern about the medication if he feel worsening of the symptoms.  I will see him again in 3 weeks.  We discussed safety plan that anytime he has any suicidal thoughts or homicidal thoughts and he need to call 911 or go to local emergency room.  Portion of this note is generated with voice dictation software and may contain typographical error.

## 2012-05-19 ENCOUNTER — Ambulatory Visit (INDEPENDENT_AMBULATORY_CARE_PROVIDER_SITE_OTHER): Payer: Medicare Other | Admitting: Psychiatry

## 2012-05-19 ENCOUNTER — Encounter (HOSPITAL_COMMUNITY): Payer: Self-pay | Admitting: Psychiatry

## 2012-05-19 VITALS — BP 112/77 | HR 109 | Wt 222.0 lb

## 2012-05-19 DIAGNOSIS — F1410 Cocaine abuse, uncomplicated: Secondary | ICD-10-CM

## 2012-05-19 DIAGNOSIS — F1010 Alcohol abuse, uncomplicated: Secondary | ICD-10-CM

## 2012-05-19 DIAGNOSIS — F319 Bipolar disorder, unspecified: Secondary | ICD-10-CM

## 2012-05-19 MED ORDER — HYDROXYZINE PAMOATE 50 MG PO CAPS: 50.0000 mg | ORAL_CAPSULE | Freq: Three times a day (TID) | ORAL | Status: AC | PRN

## 2012-05-19 MED ORDER — RISPERIDONE 1 MG PO TABS: 1.0000 mg | ORAL_TABLET | Freq: Every day | ORAL | Status: DC

## 2012-05-19 MED ORDER — GABAPENTIN 300 MG PO CAPS: 300.0000 mg | ORAL_CAPSULE | Freq: Three times a day (TID) | ORAL | Status: DC

## 2012-05-19 NOTE — Progress Notes (Signed)
Chief complaint I need medication for panic attack.  History presenting illness Patient is 46 year old Caucasian unemployed single man who came for his followup appointment.  He was seen in this office almost 3 weeks ago for seeking treatment of his anxiety and panic attack.  Patient endorsed long history of mood swing anger and agitation.  He is currently house arrest.  He was started on Risperdal and Neurontin which patient reported that he is taking but continued to demand medication for panic attack.  He told he was given Ativan when he was in the emergency room for panic attack and that works better.  He denies any drinking since last seen but appears easily irritable and agitated.  He endorse if he has not given Ativan he may relapse into drinking .  He was fixated on panic attack symptoms which apparently last for a few minutes.  He denies any suicidal thoughts or homicidal thoughts.  He denies any side effects of Risperdal or Neurontin.  He admitted taking Neurontin up to 3 times a day which help somewhat.  He also endorse sleeping better when I questioned about his sleep pattern.  He has not seen therapist in this office an appointment is in process.  Current psychiatric medication Risperdal 1 mg tablet at bedtime Neurontin 300 mg up to 3 times a day    Past psychiatric history Patient admitted history of psychiatric illness and at least one [facility].  He was seeing Dr. Katrinka Blazing and remembered taking Prozac Zoloft Paxil Seroquel with limited response.  He also remembered taking lithium and Risperdal but do not remember the details.  Patient denies any previous history of suicidal attempt however endorse history of significant mood swing anger agitation and paranoia.    Psychosocial history Patient was born and raised in [place].  He has been married once however his marriage last only for 2 years .  Patient admitted his anger was an  issue that his wife left him.  He has one daughter with his wife who is currently living with her.  Patient has girlfriend for 13 years and he has 2 children with her however 3 and half years ago he left her without telling her and when he returned the locks were changed.  Patient has not seen his children and girlfriend in past 3 years.  Patient currently lives with his father .  He has not seen his mother for a long time.    Family history Patient endorse sister has bipolar disorder.  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   Legal history Patient recently came out from jail last cougar after serving 8 months.  Currently he is a house arrest.  Patient told he was arrested for the crime he never dated.  Apparently the patient's friend was arrested with the stolen property and his friend ran from the police and patient was sitting in that stolen property car.   Alcohol and substance use history Patient admitted history of using cocaine , marijuana and alcohol .  He admitted heavy alcohol use in his past.  He also remembered taking pain medication. He smokes marijuana on a regular basis .  He still drinks sixpacks every 3-4 days.  However he denies recent drinking and smoking marijuana. He denies a history of DTs, DWI or any withdrawals.    Education work history Patient has 2 years college .  Currently he is  not working .  Medical history Patient has history of myocardial infarction , DVT, hyperlipidemia, peripheral artery disease, femoral bypass and cardiac catheterization.  His primary care physician is [employer] and on [facility].    Mental status examination Patient is casually dressed and fairly groomed.  He appears easily irritable and restless.  He appears demanding to get Ativan for his panic attack.  His behavior was inappropriate at times when he start laughing about the medication which was given for his mood.  At times he is  guarded about his symptoms.  His speech is fast and pressured but relevant.  He has difficulty concentrating his thoughts.  He denies any active or passive suicidal thoughts or homicidal thoughts.  He described his mood is irritable and his affect is mood appropriate.  He endorse auditory hallucination believing people calling his name and paranoid thinking that people are talking about him .  His attention and concentration is poor.  He has difficulty recalling names of medication and old evens.  His thought process is slow .  He is labile and demanding.  His fund of knowledge is average.  He has no tremors or shakes.  His insight judgment is fair his impulse control is fair.  Assessment Axis I bipolar disorder , alcohol abuse , cocaine abuse in partial remission  Axis II deferred Axis III history of bicarbonate infarction, DVT, hyperlipidemia, peripheral artery disease, femoral bypass and cardiac catheterization.   Axis IV moderate  Axis V 50-60  Plan I reviewed her chart and last progress note.  I have a long discussion with this patient about his symptoms prognosis and response to the medication.  We discussed in detail about the benzodiazepine tolerance dependence and withdrawal symptoms and especially who has underlying disorder or substance use.  Patient was not happy however he agreed to try different medication for his panic attack.  I recommend to increase his Neurontin to 300 mg 3 times a day and take Risperdal 1 mg at bedtime.  I will also provide Vistaril 50 mg up to 3 times a day for extreme anxiety and panic attack.  I explain in detail the risks and benefits of medication.  We will refer him for counseling for coping and social skills.  I also recommend to stop his drinking and substance use due to potential interaction of his illness with continued use of substance.  Time spent 30 minutes I will see him again in 4 weeks.  Safety plan discussed that anytime he has suicidal thoughts or  homicidal thoughts and he need to call 911 or go to local emergency room.    Portion of this note is generated with voice dictation software and may contain typographical error.

## 2012-06-06 ENCOUNTER — Ambulatory Visit (HOSPITAL_COMMUNITY): Payer: Self-pay | Admitting: Psychiatry

## 2012-06-13 ENCOUNTER — Other Ambulatory Visit (HOSPITAL_COMMUNITY): Payer: Self-pay | Admitting: Psychiatry

## 2012-06-13 NOTE — Progress Notes (Signed)
Call received from his sister who reported that patient is in jail.  Sister is wondering what kind of medication patient is taking.  She was in from that detail cannot be provided unless we have written consent.

## 2012-06-23 ENCOUNTER — Ambulatory Visit (HOSPITAL_COMMUNITY): Payer: Self-pay | Admitting: Psychiatry

## 2012-07-03 ENCOUNTER — Encounter: Payer: Self-pay | Admitting: Vascular Surgery

## 2012-07-10 ENCOUNTER — Ambulatory Visit (HOSPITAL_COMMUNITY): Payer: Self-pay | Admitting: Licensed Clinical Social Worker

## 2012-07-10 ENCOUNTER — Encounter (HOSPITAL_COMMUNITY): Payer: Self-pay | Admitting: Licensed Clinical Social Worker

## 2012-07-10 NOTE — Progress Notes (Signed)
Patient ID: [name], male   DOB: 03-08-1966, 46 y.o.   MRN: [id number] Patient was a no show for his first therapy appointment.

## 2012-07-26 ENCOUNTER — Other Ambulatory Visit (HOSPITAL_COMMUNITY): Payer: Self-pay | Admitting: Psychiatry

## 2012-07-27 ENCOUNTER — Other Ambulatory Visit (HOSPITAL_COMMUNITY): Payer: Self-pay | Admitting: Psychiatry

## 2018-07-06 ENCOUNTER — Emergency Department (HOSPITAL_COMMUNITY)
Admission: EM | Admit: 2018-07-06 | Discharge: 2018-07-07 | Disposition: A | Discharge status: Home / Self Care | Payer: Medicare Other | Attending: Emergency Medicine | Admitting: Emergency Medicine

## 2018-07-06 DIAGNOSIS — F1910 Other psychoactive substance abuse, uncomplicated: Secondary | ICD-10-CM | POA: Insufficient documentation

## 2018-07-06 DIAGNOSIS — F319 Bipolar disorder, unspecified: Secondary | ICD-10-CM | POA: Insufficient documentation

## 2018-07-06 DIAGNOSIS — Z79899 Other long term (current) drug therapy: Secondary | ICD-10-CM | POA: Insufficient documentation

## 2018-07-06 DIAGNOSIS — J45909 Unspecified asthma, uncomplicated: Secondary | ICD-10-CM | POA: Insufficient documentation

## 2018-07-06 DIAGNOSIS — Z7901 Long term (current) use of anticoagulants: Secondary | ICD-10-CM | POA: Insufficient documentation

## 2018-07-06 DIAGNOSIS — F1414 Cocaine abuse with cocaine-induced mood disorder: Secondary | ICD-10-CM | POA: Insufficient documentation

## 2018-07-06 DIAGNOSIS — R45851 Suicidal ideations: Secondary | ICD-10-CM | POA: Insufficient documentation

## 2018-07-06 DIAGNOSIS — I2510 Atherosclerotic heart disease of native coronary artery without angina pectoris: Secondary | ICD-10-CM | POA: Insufficient documentation

## 2018-07-06 DIAGNOSIS — I252 Old myocardial infarction: Secondary | ICD-10-CM | POA: Insufficient documentation

## 2018-07-06 DIAGNOSIS — F1420 Cocaine dependence, uncomplicated: Secondary | ICD-10-CM | POA: Insufficient documentation

## 2018-07-06 DIAGNOSIS — F17210 Nicotine dependence, cigarettes, uncomplicated: Secondary | ICD-10-CM | POA: Insufficient documentation

## 2018-07-06 NOTE — ED Triage Notes (Signed)
Pt bib [facility] with complaints of suicidal ideation. Pt has hx of PTSD and sever panic disorder. Pt advised his family today that he wanted to hang himself and set himself on fire. Pt also advised family he wanted to go to [facility] for rehab to get away from drug dealers here. Family believes the Pt is also using drugs but does not know what.

## 2018-07-06 NOTE — ED Notes (Signed)
Bed: WA17 Expected date:  Expected time:  Means of arrival:  Comments: IVC 

## 2018-07-07 ENCOUNTER — Other Ambulatory Visit: Payer: Self-pay

## 2018-07-07 DIAGNOSIS — F1414 Cocaine abuse with cocaine-induced mood disorder: Secondary | ICD-10-CM | POA: Diagnosis present

## 2018-07-07 LAB — RAPID URINE DRUG SCREEN, HOSP PERFORMED
AMPHETAMINES: NOT DETECTED
BARBITURATES: NOT DETECTED
BENZODIAZEPINES: NOT DETECTED
COCAINE: POSITIVE — AB
Opiates: NOT DETECTED
Tetrahydrocannabinol: NOT DETECTED

## 2018-07-07 LAB — COMPREHENSIVE METABOLIC PANEL
ALT: 11 U/L (ref 0–44)
ANION GAP: 11 (ref 5–15)
AST: 17 U/L (ref 15–41)
Albumin: 4 g/dL (ref 3.5–5.0)
Alkaline Phosphatase: 88 U/L (ref 38–126)
BUN: 6 mg/dL (ref 6–20)
CO2: 29 mmol/L (ref 22–32)
Calcium: 9 mg/dL (ref 8.9–10.3)
Chloride: 106 mmol/L (ref 98–111)
Creatinine, Ser: 0.85 mg/dL (ref 0.61–1.24)
Glucose, Bld: 90 mg/dL (ref 70–99)
POTASSIUM: 4.1 mmol/L (ref 3.5–5.1)
Sodium: 146 mmol/L — ABNORMAL HIGH (ref 135–145)
TOTAL PROTEIN: 7.4 g/dL (ref 6.5–8.1)
Total Bilirubin: 0.6 mg/dL (ref 0.3–1.2)

## 2018-07-07 LAB — CBC WITH DIFFERENTIAL/PLATELET
BASOS ABS: 0 10*3/uL (ref 0.0–0.1)
Basophils Relative: 0 %
EOS ABS: 0.4 10*3/uL (ref 0.0–0.7)
EOS PCT: 5 %
HCT: 49.1 % (ref 39.0–52.0)
Hemoglobin: 17 g/dL (ref 13.0–17.0)
LYMPHS PCT: 31 %
Lymphs Abs: 2.6 10*3/uL (ref 0.7–4.0)
MCH: 29.3 pg (ref 26.0–34.0)
MCHC: 34.6 g/dL (ref 30.0–36.0)
MCV: 84.7 fL (ref 78.0–100.0)
MONO ABS: 0.7 10*3/uL (ref 0.1–1.0)
Monocytes Relative: 8 %
Neutro Abs: 4.7 10*3/uL (ref 1.7–7.7)
Neutrophils Relative %: 56 %
Platelets: 236 10*3/uL (ref 150–400)
RBC: 5.8 MIL/uL (ref 4.22–5.81)
RDW: 13.6 % (ref 11.5–15.5)
WBC: 8.3 10*3/uL (ref 4.0–10.5)

## 2018-07-07 LAB — ACETAMINOPHEN LEVEL

## 2018-07-07 LAB — CBG MONITORING, ED: Glucose-Capillary: 79 mg/dL (ref 70–99)

## 2018-07-07 LAB — ETHANOL: Alcohol, Ethyl (B): <10 mg/dL (ref <10)

## 2018-07-07 LAB — SALICYLATE LEVEL

## 2018-07-07 LAB — PROTIME-INR
INR: 0.95
PROTHROMBIN TIME: 12.6 s (ref 11.4–15.2)

## 2018-07-07 MED ORDER — PANTOPRAZOLE SODIUM 20 MG PO TBEC
20.0000 mg | DELAYED_RELEASE_TABLET | Freq: Once | ORAL | Status: AC
  Administered 2018-07-07: 20 mg via ORAL
  Filled 2018-07-07: qty 1

## 2018-07-07 MED ORDER — NICOTINE 21 MG/24HR TD PT24
21.0000 mg | MEDICATED_PATCH | Freq: Every day | TRANSDERMAL | Status: DC
  Administered 2018-07-07: 21 mg via TRANSDERMAL
  Filled 2018-07-07: qty 1

## 2018-07-07 MED ORDER — ONDANSETRON HCL 4 MG PO TABS: 4.0000 mg | ORAL_TABLET | Freq: Three times a day (TID) | ORAL | Status: DC | PRN

## 2018-07-07 MED ORDER — ALUM & MAG HYDROXIDE-SIMETH 200-200-20 MG/5ML PO SUSP: 30.0000 mL | Freq: Four times a day (QID) | ORAL | Status: DC | PRN

## 2018-07-07 MED ORDER — ACETAMINOPHEN 325 MG PO TABS: 650.0000 mg | ORAL_TABLET | ORAL | Status: DC | PRN

## 2018-07-07 NOTE — ED Notes (Signed)
Pt. In burgundy scrubs. Pt. Has 1 belongings bag. Pt. Has 1 pr. Yellow boots, 1 black cell phone and 1 gray t-shirt, wallet, and keys. Pt. Belongings locked up in cabinet between RES A and B.

## 2018-07-07 NOTE — BHH Suicide Risk Assessment (Signed)
Suicide Risk Assessment  Discharge Assessment   [facility] Discharge Suicide Risk Assessment   Principal Problem: Cocaine abuse with cocaine-induced mood disorder [facility]) Discharge Diagnoses:  Patient Active Problem List   Diagnosis Date Noted  . Cocaine abuse with cocaine-induced mood disorder (HCC) [F14.14] 07/07/2018    Priority: High  . Dyslipidemia [E78.5] 02/09/2012  . Cocaine abuse (HCC) [F14.10] 02/09/2012  . Anxiety [F41.9] 08/09/2011  . CAD (coronary artery disease) [I25.10] 08/07/2011  . Acute thrombus of left ventricle (HCC) [I24.0] 08/07/2011  . TOBACCO ABUSE [F17.200] 01/25/2010  . CHEST PAIN [R07.9] 01/25/2010  . 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 PVD WITH CLAUDICATION [I73.89] 02/15/2009  . GERD [K21.9] 02/15/2009  . HIATAL HERNIA [K44.9] 02/15/2009  . CONSTIPATION [K59.00] 02/15/2009    Total Time spent with patient: 45 minutes  Musculoskeletal: Strength & Muscle Tone: within normal limits Gait & Station: normal Patient leans: N/A  Psychiatric Specialty Exam:   Blood pressure 116/76, pulse 81, resp. rate 18, height 5\' 10"  (1.778 m), weight 100.7 kg, SpO2 100 %.Body mass index is 31.85 kg/m.  General Appearance: Casual  Eye Contact::  Good  Speech:  Normal Rate409  Volume:  Normal  Mood:  Irritable  Affect:  Congruent  Thought Process:  Coherent and Descriptions of Associations: Intact  Orientation:  Full (Time, Place, and Person)  Thought Content:  WDL and Logical  Suicidal Thoughts:  No  Homicidal Thoughts:  No  Memory:  Immediate;   Good Recent;   Good Remote;   Good  Judgement:  Fair  Insight:  Good  Psychomotor Activity:  Normal  Concentration:  Good  Recall:  Good  Fund of Knowledge:Good  Language: Good  Akathisia:  No  Handed:  Right  AIMS (if indicated):     Assets:  Housing Leisure Time Physical Health Resilience Social Support  Sleep:     Cognition: WNL  ADL's:  Intact   Mental Status Per Nursing Assessment::    On Admission:   52 yo male who was IVC'd by his sister for alleged suicidal ideations.  He denies this and reports he and his sister are at odds over their father and his assets.  No suicidal ideations or past attempt, no homicidal ideations, hallucinations.  Minimizes his cocaine abuse, no withdrawal symptoms.  Stable for discharge.  Demographic Factors:  Male, Caucasian and Living alone  Loss Factors: NA  Historical Factors: NA  Risk Reduction Factors:   Sense of responsibility to family and Positive social support  Continued Clinical Symptoms:  Irritable, mild  Cognitive Features That Contribute To Risk:  None    Suicide Risk:  Minimal: No identifiable suicidal ideation.  Patients presenting with no risk factors but with morbid ruminations; may be classified as minimal risk based on the severity of the depressive symptoms    Plan Of Care/Follow-up recommendations:  Activity:  as tolerated Diet:  heart healthy diet  LORD, JAMISON, NP 07/07/2018, 9:51 AM
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
## 2018-07-07 NOTE — Patient Outreach (Signed)
[facility] met with patient to provide substance use recovery support and help with recovery resources. Patient states that he plans to move to [place], [place] to be with a friend to support his substance use recovery process. Patient denied wanting help getting connected to substance use treatment in [place]. [facility]PSS still provide [facility] contact information. [facility] strongly encouraged the patient to follow up with [facility] if he decides he wants help with getting connected to substance use treatment in [place].
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
## 2018-07-07 NOTE — ED Notes (Signed)
Attempted lab draws x 2 but unsuccessful. RN,Amanda made aware.

## 2018-07-07 NOTE — ED Notes (Signed)
Pt d/c home per MD order. Discharge summary reviewed with pt, pt verbalizes understanding. Pt denies SI/HI/AVH. Personal property returned. Pt signed e-signature. Ambulatory off unit.  

## 2018-07-07 NOTE — Discharge Instructions (Signed)
To help you maintain a sober lifestyle, a substance abuse treatment program may be beneficial to you.  Contact [facility] at your earliest opportunity to ask about enrolling in their program: ° °     [facility] °     [address] °     [place], [place] [address] °     [phone] °     New patients are seen at the walk-in clinic every Tuesday from 9:00 am - 12:00 pm °

## 2018-07-07 NOTE — BH Assessment (Signed)
[facility] Assessment Progress Note  Per Juanetta Beets, DO, this pt does not require psychiatric hospitalization at this time.  Pt presents under IVC initiated by pt's sister, which Dr Sharma Covert has rescinded.  Pt is to be discharged from [facility] with recommendation to follow up with [facility].  This has been included in pt's discharge instructions.  Pt would also benefit from seeing Peer Support Specialists; they will be asked to speak to pt.  Pt's nurse, Morrie Sheldon, has been notified.  Doylene Canning, MA Triage Specialist [phone]

## 2018-07-07 NOTE — ED Provider Notes (Signed)
[facility]-EMERGENCY DEPT Provider Note   CSN: [id number] Arrival date & time: 07/06/18  2354     History   Chief Complaint Chief Complaint  Patient presents with  . Suicidal  . Psychiatric Evaluation    HPI [name] Krutz. is a 52 y.o. male.  The history is provided by the patient, the police and medical records.  Mental Health Problem  Presenting symptoms: suicidal threats   Presenting symptoms: no disorganized speech, no disorganized thought process and no self-mutilation   Patient accompanied by:  Law enforcement Degree of incapacity (severity):  Moderate Onset quality:  Gradual Timing:  Constant Progression:  Unchanged Chronicity:  Recurrent Context: alcohol use and drug abuse   Treatment compliance:  Untreated Relieved by:  Nothing Worsened by:  Nothing Ineffective treatments:  None tried Associated symptoms: no abdominal pain, no chest pain, no headaches and no school problems   Risk factors: hx of mental illness   Patient under IVC by sister for stating he was going to hang himself.  He states he was just "trolling her."    Past Medical History:  Diagnosis Date  . Anxiety   . Asthma   . DVT of lower extremity, bilateral    PT reports a HX of  Bil DVT . PT takes warfarin  andeffient  . History of ETOH abuse   . Hyperlipidemia   . Myocardial infarct, old    pt D/C  from [facility] 4days ago  . PAD (peripheral artery disease)   . Substance abuse in remission     Patient Active Problem List   Diagnosis Date Noted  . Dyslipidemia 02/09/2012  . Cocaine abuse (HCC) 02/09/2012  . Anxiety 08/09/2011  . CAD (coronary artery disease) 08/07/2011  . Acute thrombus of left ventricle (HCC) 08/07/2011  . TOBACCO ABUSE 01/25/2010  . CHEST PAIN 01/25/2010  . POST TRAUMATIC STRESS SYNDROME 02/15/2009  . PVD WITH CLAUDICATION 02/15/2009  . GERD 02/15/2009  . HIATAL HERNIA 02/15/2009  . CONSTIPATION 02/15/2009    Past Surgical History:    Procedure Laterality Date  . CARDIAC CATHETERIZATION    . CHOLECYSTECTOMY    . FEMORAL BYPASS     info per pt        Home Medications    Prior to Admission medications   Medication Sig Start Date End Date Taking? Authorizing Provider  atorvastatin (LIPITOR) 20 MG tablet Take 1 tablet (20 mg total) by mouth daily at 6 PM. 02/10/12 02/09/13  Dwana Melena, PA-C  gabapentin (NEURONTIN) 300 MG capsule Take 1 capsule (300 mg total) by mouth 3 (three) times daily. 05/19/12 05/19/13  Arfeen, Phillips Grout, MD  risperiDONE (RISPERDAL) 1 MG tablet Take 1 tablet (1 mg total) by mouth at bedtime. 05/19/12   Arfeen, Phillips Grout, MD  warfarin (COUMADIN) 5 MG tablet Take 7.5-10 mg by mouth daily. Take 2 tablets on Monday, Wednesday, Friday and then take 1.5 tablets all other days    [provider]    Family History Family History  Problem Relation Age of Onset  . Bipolar disorder Sister     Social History Social History   Tobacco Use  . Smoking status: Current Every Day Smoker    Packs/day: 1.00    Types: Cigarettes  Substance Use Topics  . Alcohol use: Yes  . Drug use: No     Allergies   Patient has no known allergies.   Review of Systems Review of Systems  Eyes: Negative for visual disturbance.  Respiratory: Negative for shortness of breath.   Cardiovascular: Negative for chest pain.  Gastrointestinal: Negative for abdominal pain.  Neurological: Negative for dizziness, seizures, facial asymmetry, speech difficulty and headaches.  Psychiatric/Behavioral: Negative for confusion and self-injury.  All other systems reviewed and are negative.    Physical Exam Updated Vital Signs There were no vitals taken for this visit.  Physical Exam  Constitutional: He is oriented to person, place, and time. He appears well-developed and well-nourished. No distress.  HENT:  Head: Normocephalic and atraumatic.  Mouth/Throat: No oropharyngeal exudate.  Eyes: Pupils are equal, round, and  reactive to light. Conjunctivae are normal.  Neck: Normal range of motion. Neck supple.  Cardiovascular: Normal rate, regular rhythm, normal heart sounds and intact distal pulses.  Pulmonary/Chest: Effort normal and breath sounds normal. No stridor. He has no wheezes. He has no rales.  Abdominal: Soft. Bowel sounds are normal. He exhibits no mass. There is no tenderness. There is no rebound and no guarding.  Musculoskeletal: Normal range of motion.  Neurological: He is alert and oriented to person, place, and time. He displays normal reflexes.  Skin: Skin is warm and dry. Capillary refill takes less than 2 seconds.  Psychiatric: He is aggressive.     ED Treatments / Results  Labs (all labs ordered are listed, but only abnormal results are displayed) Results for orders placed or performed during the hospital encounter of 07/06/18  Comprehensive metabolic panel  Result Value Ref Range   Sodium 146 (H) 135 - 145 mmol/L   Potassium 4.1 3.5 - 5.1 mmol/L   Chloride 106 98 - 111 mmol/L   CO2 29 22 - 32 mmol/L   Glucose, Bld 90 70 - 99 mg/dL   BUN 6 6 - 20 mg/dL   Creatinine, Ser 1.61 0.61 - 1.24 mg/dL   Calcium 9.0 8.9 - 09.6 mg/dL   Total Protein 7.4 6.5 - 8.1 g/dL   Albumin 4.0 3.5 - 5.0 g/dL   AST 17 15 - 41 U/L   ALT 11 0 - 44 U/L   Alkaline Phosphatase 88 38 - 126 U/L   Total Bilirubin 0.6 0.3 - 1.2 mg/dL   GFR calc non Af Amer >60 >60 mL/min   GFR calc Af Amer >60 >60 mL/min   Anion gap 11 5 - 15  Salicylate level  Result Value Ref Range   Salicylate Lvl <7.0 2.8 - 30.0 mg/dL  Acetaminophen level  Result Value Ref Range   Acetaminophen (Tylenol), Serum <10 (L) 10 - 30 ug/mL  Ethanol  Result Value Ref Range   Alcohol, Ethyl (B) <10 <10 mg/dL  CBC WITH DIFFERENTIAL  Result Value Ref Range   WBC 8.3 4.0 - 10.5 K/uL   RBC 5.80 4.22 - 5.81 MIL/uL   Hemoglobin 17.0 13.0 - 17.0 g/dL   HCT 04.5 40.9 - 81.1 %   MCV 84.7 78.0 - 100.0 fL   MCH 29.3 26.0 - 34.0 pg   MCHC 34.6  30.0 - 36.0 g/dL   RDW 91.4 78.2 - 95.6 %   Platelets 236 150 - 400 K/uL   Neutrophils Relative % 56 %   Neutro Abs 4.7 1.7 - 7.7 K/uL   Lymphocytes Relative 31 %   Lymphs Abs 2.6 0.7 - 4.0 K/uL   Monocytes Relative 8 %   Monocytes Absolute 0.7 0.1 - 1.0 K/uL   Eosinophils Relative 5 %   Eosinophils Absolute 0.4 0.0 - 0.7 K/uL   Basophils Relative 0 %   Basophils  Absolute 0.0 0.0 - 0.1 K/uL  CBG monitoring, ED  Result Value Ref Range   Glucose-Capillary 79 70 - 99 mg/dL   No results found.  Radiology No results found.  Procedures Procedures (including critical care time)   Final Clinical Impressions(s) / ED Diagnoses   Medically cleared by me for psychiatry, holding orders placed.  Disposition by that service.    Janique Hoefer, MD 07/07/18 [phone]

## 2018-07-07 NOTE — ED Notes (Signed)
PT  Requested to give him some time after being stuck to be stuck again for the [facility] top, this nurse will go back in and attempt in 10 min

## 2018-07-07 NOTE — ED Notes (Signed)
Pt taking a shower 

## 2018-07-07 NOTE — Progress Notes (Signed)
Per Donell Sievert, PA pt is recommended for continued observation for safety and stabilization and to be reassessed in the AM by psych. EDP Palumbo, April, MD has been advised. Attempted to call RN to advise of disposition but did not receive an answer. Will call again later to advise of disposition.   Princess Bruins, MSW, LCSW Therapeutic Triage Specialist  [phone]
